# Patient Record
Sex: Female | Born: 1965 | Race: White | Hispanic: No | Smoking: Current every day smoker
Health system: Southern US, Community
[De-identification: ages and names within clinical notes are randomized; demographics above are authoritative.]

## PROBLEM LIST (undated history)

## (undated) DIAGNOSIS — I10 Essential (primary) hypertension: Secondary | ICD-10-CM

## (undated) DIAGNOSIS — O24919 Unspecified diabetes mellitus in pregnancy, unspecified trimester: Secondary | ICD-10-CM

## (undated) DIAGNOSIS — R87810 Cervical high risk human papillomavirus (HPV) DNA test positive: Secondary | ICD-10-CM

## (undated) DIAGNOSIS — K219 Gastro-esophageal reflux disease without esophagitis: Secondary | ICD-10-CM

## (undated) DIAGNOSIS — K589 Irritable bowel syndrome without diarrhea: Secondary | ICD-10-CM

## (undated) DIAGNOSIS — R197 Diarrhea, unspecified: Secondary | ICD-10-CM

## (undated) DIAGNOSIS — F419 Anxiety disorder, unspecified: Secondary | ICD-10-CM

## (undated) HISTORY — DX: Essential (primary) hypertension: I10

## (undated) HISTORY — DX: Unspecified diabetes mellitus in pregnancy, unspecified trimester: O24.919

## (undated) HISTORY — DX: Diarrhea, unspecified: R19.7

## (undated) HISTORY — DX: Gastro-esophageal reflux disease without esophagitis: K21.9

## (undated) HISTORY — DX: Anxiety disorder, unspecified: F41.9

## (undated) HISTORY — DX: Cervical high risk human papillomavirus (HPV) DNA test positive: R87.810

## (undated) HISTORY — DX: Irritable bowel syndrome, unspecified: K58.9

---
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
## 1998-02-08 ENCOUNTER — Other Ambulatory Visit: Admission: RE | Admit: 1998-02-08 | Discharge: 1998-02-08 | Payer: Self-pay | Admitting: *Deleted

## 1998-08-17 HISTORY — PX: CHOLECYSTECTOMY: SHX55

## 2000-03-10 ENCOUNTER — Other Ambulatory Visit: Admission: RE | Admit: 2000-03-10 | Discharge: 2000-03-10 | Payer: Self-pay | Admitting: Gynecology

## 2000-08-17 HISTORY — PX: TUBAL LIGATION: SHX77

## 2002-03-29 ENCOUNTER — Encounter (INDEPENDENT_AMBULATORY_CARE_PROVIDER_SITE_OTHER): Payer: Self-pay | Admitting: Internal Medicine

## 2002-03-29 ENCOUNTER — Ambulatory Visit (HOSPITAL_COMMUNITY): Admission: RE | Admit: 2002-03-29 | Discharge: 2002-03-29 | Payer: Self-pay | Admitting: Internal Medicine

## 2003-10-22 ENCOUNTER — Ambulatory Visit (HOSPITAL_COMMUNITY): Admission: RE | Admit: 2003-10-22 | Discharge: 2003-10-22 | Payer: Self-pay | Admitting: Family Medicine

## 2003-11-25 ENCOUNTER — Emergency Department (HOSPITAL_COMMUNITY): Admission: AD | Admit: 2003-11-25 | Discharge: 2003-11-25 | Payer: Self-pay | Admitting: Family Medicine

## 2004-01-10 ENCOUNTER — Ambulatory Visit (HOSPITAL_COMMUNITY): Admission: RE | Admit: 2004-01-10 | Discharge: 2004-01-10 | Payer: Self-pay | Admitting: Internal Medicine

## 2004-05-27 ENCOUNTER — Ambulatory Visit (HOSPITAL_COMMUNITY): Admission: RE | Admit: 2004-05-27 | Discharge: 2004-05-27 | Payer: Self-pay | Admitting: Obstetrics & Gynecology

## 2006-09-06 ENCOUNTER — Ambulatory Visit (HOSPITAL_COMMUNITY): Admission: RE | Admit: 2006-09-06 | Discharge: 2006-09-06 | Payer: Self-pay | Admitting: Obstetrics and Gynecology

## 2006-09-21 ENCOUNTER — Encounter: Admission: RE | Admit: 2006-09-21 | Discharge: 2006-09-21 | Payer: Self-pay | Admitting: Obstetrics and Gynecology

## 2008-10-02 ENCOUNTER — Other Ambulatory Visit: Admission: RE | Admit: 2008-10-02 | Discharge: 2008-10-02 | Payer: Self-pay | Admitting: Obstetrics & Gynecology

## 2008-10-02 ENCOUNTER — Ambulatory Visit (HOSPITAL_COMMUNITY): Admission: RE | Admit: 2008-10-02 | Discharge: 2008-10-02 | Payer: Self-pay | Admitting: Obstetrics & Gynecology

## 2009-08-17 HISTORY — PX: ENDOMETRIAL ABLATION: SHX621

## 2009-12-06 ENCOUNTER — Ambulatory Visit (HOSPITAL_COMMUNITY): Admission: RE | Admit: 2009-12-06 | Discharge: 2009-12-06 | Payer: Self-pay | Admitting: Family Medicine

## 2010-06-23 ENCOUNTER — Other Ambulatory Visit: Admission: RE | Admit: 2010-06-23 | Discharge: 2010-06-23 | Payer: Self-pay | Admitting: Obstetrics & Gynecology

## 2010-06-23 ENCOUNTER — Ambulatory Visit (HOSPITAL_COMMUNITY)
Admission: RE | Admit: 2010-06-23 | Discharge: 2010-06-23 | Payer: Self-pay | Source: Home / Self Care | Admitting: Family Medicine

## 2010-07-30 ENCOUNTER — Ambulatory Visit (HOSPITAL_COMMUNITY)
Admission: RE | Admit: 2010-07-30 | Discharge: 2010-07-30 | Payer: Self-pay | Source: Home / Self Care | Attending: Obstetrics & Gynecology | Admitting: Obstetrics & Gynecology

## 2010-09-07 ENCOUNTER — Encounter: Payer: Self-pay | Admitting: Obstetrics and Gynecology

## 2010-10-28 LAB — SURGICAL PCR SCREEN
MRSA, PCR: NEGATIVE
Staphylococcus aureus: NEGATIVE

## 2010-10-28 LAB — COMPREHENSIVE METABOLIC PANEL
ALT: 9 U/L (ref 0–35)
AST: 13 U/L (ref 0–37)
Albumin: 4.3 g/dL (ref 3.5–5.2)
Alkaline Phosphatase: 54 U/L (ref 39–117)
BUN: 9 mg/dL (ref 6–23)
CO2: 27 mEq/L (ref 19–32)
Calcium: 9.5 mg/dL (ref 8.4–10.5)
Chloride: 103 mEq/L (ref 96–112)
Creatinine, Ser: 0.6 mg/dL (ref 0.4–1.2)
GFR calc Af Amer: >60 mL/min (ref >60)
GFR calc non Af Amer: >60 mL/min (ref >60)
Glucose, Bld: 98 mg/dL (ref 70–99)
Potassium: 3.9 mEq/L (ref 3.5–5.1)
Sodium: 137 mEq/L (ref 135–145)
Total Bilirubin: 0.6 mg/dL (ref 0.3–1.2)
Total Protein: 6.9 g/dL (ref 6.0–8.3)

## 2010-10-28 LAB — CBC
HCT: 42 % (ref 36.0–46.0)
Hemoglobin: 15.2 g/dL — ABNORMAL HIGH (ref 12.0–15.0)
MCH: 33.1 pg (ref 26.0–34.0)
MCHC: 36.2 g/dL — ABNORMAL HIGH (ref 30.0–36.0)
MCV: 91.5 fL (ref 78.0–100.0)
Platelets: 189 10*3/uL (ref 150–400)
RBC: 4.59 MIL/uL (ref 3.87–5.11)
RDW: 13.7 % (ref 11.5–15.5)
WBC: 9.8 10*3/uL (ref 4.0–10.5)

## 2010-10-28 LAB — URINALYSIS, ROUTINE W REFLEX MICROSCOPIC
Bilirubin Urine: NEGATIVE
Glucose, UA: NEGATIVE mg/dL
Ketones, ur: NEGATIVE mg/dL
Leukocytes, UA: NEGATIVE
Nitrite: NEGATIVE
Protein, ur: NEGATIVE mg/dL
Specific Gravity, Urine: 1.02 (ref 1.005–1.030)
Urobilinogen, UA: 0.2 mg/dL (ref 0.0–1.0)
pH: 6.5 (ref 5.0–8.0)

## 2010-10-28 LAB — URINE MICROSCOPIC-ADD ON

## 2010-10-28 LAB — HCG, QUANTITATIVE, PREGNANCY: hCG, Beta Chain, Quant, S: <2 m[IU]/mL (ref <5)

## 2011-01-02 NOTE — Consult Note (Signed)
NAMEANNISE, BORAN                           ACCOUNT NO.:  [id number]   MEDICAL RECORD NO.:  [id number]                  PATIENT TYPE:   LOCATION:                                       FACILITY:   PHYSICIAN:  Lionel December, M.D.                 DATE OF BIRTH:  Nov 20, 1965   DATE OF CONSULTATION:  01/09/2004  DATE OF DISCHARGE:                                   CONSULTATION   REFERRING PHYSICIAN:  Scott A. Gerda Diss, M.D.   REASON FOR CONSULTATION:  Right upper quadrant abdominal pain.   HISTORY OF PRESENT ILLNESS:  Mrs. Zakayla Mcdougald is a 45 year old, Caucasian  female who presents with several month history of right upper quadrant  abdominal pain  which radiates to her back and right shoulder.  She is also  complaining of bilateral low back pain.  Chest pain feels just like her  gallbladder.  She notes the pain is intermittent, however, episodes had  been coming more frequently lately.  She is complaining of nausea, however,  denies any emesis.  She rates pain 6/10 on a pain scale with the pain  lasting several hours to days.  She notes the pain is relieved with  consumption of food.  She has been on Prevacid for awhile which was not  helping any.  She has been changed to Aciphex over the last two weeks which  has helped about 50% with her symptoms of belching, however, it has not  helped with the right upper quadrant pain.  She does have history of  intermittent GERD, however, she denies any chest pain and reports occasional  regurgitation.  She denies any dysphagia, odynophagia and has suffered from  IBS for several years now.  Her bowel movements are very irregular with two  to three per week or she can have multiple per day that are typically soft  and brown.  She denies any melena.  She does report one to two episodes of  small volume intermittent hematochezia post defecation noted on the toilet  paper over the last several months.   CURRENT MEDICATIONS:  Aciphex 20 mg  daily.   ALLERGIES:  MOTRIN.   FAMILY HISTORY:  Positive for paternal grandmother with colon cancer  diagnosed in her 49s.  Mother, age 71, has history of hypertension and  hyperlipidemia.  Father, age 17, has history of colon polyps diagnosed in  his 9s, however, she does not have details on this.  She has two brothers,  one with colon polyps of unknown etiology.   SOCIAL HISTORY:  Mrs. Shanker has been married x14 years.  She has two  children ages 32 and 45, both are healthy.  She is employed full-time as a  Diplomatic Services operational officer.  She reports 20-year history of smoking one pack per day.  She  reports an occasional social drink.  Denies any drug use.   REVIEW OF SYMPTOMS:  CONSTITUTIONAL:  Weight is up 11 pounds in the last two  years.  Appetite is okay.  Denies any fatigue, fever or chills.  Occasional  night sweats.  GASTROINTESTINAL:  See HPI.  GYN:  LMP was one week ago.  She  does have irregular cycles with typical length of menses between 1-2 weeks.  She has had tubal ligation.  GENITOURINARY:  Denies any dysuria, hematuria  or increased urinary frequency.   PAST SURGICAL HISTORY:  1. Status post cholecystectomy in March 2000, in [place], secondary to     cholelithiasis.  2. C-section x2.  3. Tubal ligation in March 2002.  4. Appendectomy in 1983.   PHYSICAL EXAMINATION:  VITAL SIGNS:  Weight 116 pounds, height 59 inches.  Blood pressure 140/80, pulse 66.  GENERAL:  Ms. Klinkner is a 45 year old, well-developed, well-nourished,  Caucasian female in no acute distress.  HEENT:  Sclerae clear.  Nonicteric.  Conjunctivae pink.  Oropharynx pink and  moist without any lesions.  NECK:  Supple without mass or thyromegaly.  HEART:  Regular rate and rhythm without murmurs, rubs or gallops.  LUNGS:  Clear to auscultation bilaterally.  ABDOMEN:  Positive bowel sounds x4, soft, nontender, nondistended, no  palpable masses or hepatosplenomegaly.  No CVA tenderness.  No rebound  tenderness or  guarding.  RECTAL:  No external hemorrhoids or fissures visualized, good sphincter  tone.  Small amount of light-brown Hemoccult stool was obtained from the  vault.  She did have palpable fullness anteriorly in the rectum about 3 cm,  however, there was no discrete mass.   ASSESSMENT:  Mrs. Glassberg is a 45 year old, Caucasian female with several  month history of intermittent right upper quadrant abdominal pain as well as  nausea.  She describes the pain similar to when she had episodes with her  gallbladder.  She denies any emesis.  She also has long-standing history of  irritable bowel syndrome and notes some recent intermittent hematochezia.  Would recommend further evaluation at this point and time to rule out  choledocholithiasis and if this is negative, would proceed with EGD and  colonoscopy given her history of intermittent hematochezia to rule out  inflammatory bowel disease.   RECOMMENDATIONS:  1. Continue Aciphex 20 mg daily.  2. Labs today to include CBC, LFTs and sedimentation rate.  3. Abdominal ultrasound.  4. Pending abdominal ultrasound, will consider EGD and colonoscopy by Dr.     Karilyn Cota at [facility] in the near future.  5. Discussed both procedures including risks and benefits including, but not     limited to infection, bleeding, perforation, drug reaction and consent     will be obtained.  We will be contacting Ms. Rathmann with results as soon     as they are available.   We would like to thank Dr. [name] to participate in the care of Mrs.  Ivens.     ________________________________________  ___________________________________________  Nicholas Lose, N.P.                  Lionel December, M.D.   KC/MEDQ  D:  01/09/2004  T:  01/10/2004  Job:  [id number]   cc:   Lorin Picket A. Gerda Diss, M.D.  [address], [address]  [place]  [place] [address]  Fax: [phone]

## 2011-01-02 NOTE — Consult Note (Signed)
NAME:  Melinda Chapman, BURKMAN                         ACCOUNT NO.:   MEDICAL RECORD NO.:  [id number]                   PATIENT TYPE:  OUT   LOCATION:  RAD                                  FACILITY:  [facility]   PHYSICIAN:  Lionel December, M.D.                 DATE OF BIRTH:  [birth year]   DATE OF CONSULTATION:  03/27/2002  DATE OF DISCHARGE:  03/29/2002                           GASTROENTEROLOGY CONSULTATION   PRESENTING COMPLAINT:  Right-sided abdominal pain, irregular bowel  movements.   HISTORY OF PRESENT ILLNESS:  The patient is a 45 year old Caucasian female  who was referred by Dr. [name] for evaluation and treatment of  abdominal pain and irregular bowel movements.  The patient states that she  has had problems with constipation most of her life.  She was given  suppositories to make her bowels move by her mother when she was a child.  She began to have less difficulty with her bowel movements after she had her  gallbladder surgery in May 2000.  However, since then she has also had  diarrhea alternating with constipation.  When she has diarrhea she has  extreme urgency.  She knows that she better be in the bathroom, otherwise  she will be in trouble.  She recalled having had five accidents since her  gallbladder surgery.  On days she has diarrhea she does not have more than  three stools per day.  This generally is followed by a period of  constipation and she may not have a BM for four days at a time.  In the past  she has taken Correctol and/or Ex-Lax, but not recently.  For the last year  or so she has been experiencing pain in her right mid lower and upper  abdomen radiating posteriorly.  Pain is described as dull, nagging pain and  at other times she feels she has swelling in her abdomen and she cannot  breathe.  She tells me that these symptoms remind her of her gallbladder  pain.  It may last anywhere from three hours to a week.  Tylenol has helped  because it takes the edge  off.  There is no history of melena or rectal  bleeding.  She also denies dysuria or hematuria.  Her urinalysis by Dr.  [name] was normal.  She states she tends to have more pain with her  periods.   REVIEW OF SYMPTOMS:  Negative for melena, rectal bleeding, vomiting,  heartburn, or dysphagia but she has had intermittent vomiting.  Her baseline  weight is 115 pounds but for the last few months she has stayed between 105-  106.  She saw Dr. [name] about two weeks ago and she was begun on  Prilosec.  It helped her with the fullness.  However, it made her very  nervous and she therefore stopped taking the medication.  She is presently  taking no prescription medications.  PAST MEDICAL HISTORY:  She had appendectomy 1983.  She has had cesarean  section in 1994 and in 2002.  She had laparoscopic cholecystectomy May 2000.   ALLERGIES:  MOTRIN which makes her nervous.   FAMILY HISTORY:  Both parents and two sisters are in good health.  Paternal  grandmother was diagnosed with colon carcinoma around age 39 and died of it  at age 34.  Paternal aunt has had colonic polyps removed.   SOCIAL HISTORY:  She is married and has two healthy children.  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  She smokes about a  pack a day which she has done for 17 years.   PHYSICAL EXAMINATION:  GENERAL:  Pleasant, well-developed Caucasian female  who is in no acute distress.  VITAL SIGNS:  She weight 105 pounds.  She is 4 feet 11 inches tall.  Pulse  70 per minute, blood pressure 120/70, temperature 97.8.  HEENT:  Conjunctivae are pink.  Sclerae are nonicteric.  Oropharyngeal  mucosa is normal.  NECK:  Without masses or thyromegaly.  CARDIAC:  Regular rate and rhythm.  Normal S1.  No murmur or gallop noted.  LUNGS:  Clear to auscultation.  ABDOMEN:  Symmetrical.  Bowel sounds are hyperactive.  Palpation reveals  soft abdomen without tenderness, organomegaly, or masses.   RECTAL:  Guaiac negative stools.  No clubbing or edema noted.   LABORATORIES:  From March 10, 2002:  WBC 7.1, H&H 15.3 and 44.2, MCV 88.9,  platelet count 179,000.  Bilirubin 0.9, AT 71, AST 12, ALT 10, total protein  7.8, albumin 5.3, serum amylase 35.  H. pylori serology is negative.   ASSESSMENT:  The patient is a 45 year old Caucasian female with irregular  bowel movements consisting of diarrhea alternating with constipation and  extreme urgency who also has been experiencing right-sided abdominal pain  for about a year. The pain is mainly in her right mid abdomen, but also  experienced above and below that.  There is no history of melena or rectal  bleeding.  Her CBC and LFTs are normal and Helicobacter pylori serology is  negative.  Family history is significant for colon carcinoma in second  degree relatives but no immediate family member or a first degree relative  has had gastrointestinal malignancy.   I suspect the patient's symptoms are secondary to irritable bowel syndrome.  However, given the chronicity, we need to make sure she does not have  inflammatory bowel disease or Crohn's disease.   RECOMMENDATIONS:  1. Will start her on Bentyl 10 mg t.i.d.  2. Colace one tablet b.i.d.  3. Will proceed with small bowel follow through.  If small bowel study is     normal, will consider colonoscopy if she does not respond to therapy.   I would like to thank Dr. [name] for sharing care of this nice lady  with [place].                                               Lionel December, M.D.    NR/MEDQ  D:  03/27/2002  T:  03/31/2002  Job:  [id number]   cc:   Lorin Picket A. Gerda Diss, M.D.

## 2011-03-10 ENCOUNTER — Ambulatory Visit (HOSPITAL_COMMUNITY)
Admission: RE | Admit: 2011-03-10 | Discharge: 2011-03-10 | Disposition: A | Discharge status: Home / Self Care | Payer: BC Managed Care – PPO | Source: Ambulatory Visit | Attending: Family Medicine | Admitting: Family Medicine

## 2011-03-10 ENCOUNTER — Other Ambulatory Visit: Payer: Self-pay | Admitting: Family Medicine

## 2011-03-10 DIAGNOSIS — M25551 Pain in right hip: Secondary | ICD-10-CM

## 2011-03-10 DIAGNOSIS — M25559 Pain in unspecified hip: Secondary | ICD-10-CM | POA: Insufficient documentation

## 2011-03-12 ENCOUNTER — Encounter: Payer: Self-pay | Admitting: Internal Medicine

## 2011-05-11 ENCOUNTER — Ambulatory Visit (INDEPENDENT_AMBULATORY_CARE_PROVIDER_SITE_OTHER): Payer: BC Managed Care – PPO | Admitting: Internal Medicine

## 2011-05-11 ENCOUNTER — Encounter: Payer: Self-pay | Admitting: Internal Medicine

## 2011-05-11 DIAGNOSIS — R197 Diarrhea, unspecified: Secondary | ICD-10-CM

## 2011-05-11 DIAGNOSIS — R1319 Other dysphagia: Secondary | ICD-10-CM

## 2011-05-11 DIAGNOSIS — K219 Gastro-esophageal reflux disease without esophagitis: Secondary | ICD-10-CM

## 2011-05-11 MED ORDER — PEG-KCL-NACL-NASULF-NA ASC-C 100 G PO SOLR: 1.0000 | Freq: Once | ORAL | Status: DC

## 2011-05-11 NOTE — Progress Notes (Signed)
Melinda Chapman 1965/09/16 MRN [id number]   History of Present Illness:  This is a 45 year old white female with a history of irritable bowel syndrome with alternating diarrhea and constipation. She had a recent onset of solid food dysphagia. She feels food has been hesitating and there is some pain associated with it. She has never had a food impaction. She denies hoarseness or nocturnal cough. She is a smoker. She has a history of a laparoscopic cholecystectomy in March 2000 for cholelithiasis. There is a family history of colon cancer in her paternal grandmother and a history of colon polyps in her father, brother and aunt. Her father also had an esophageal stricture.  She saw Dr Karilyn Cota in 2005 for dyspepsia and alternating diarrhea and constipation. Her upper abdominal ultrasound was apparently normal and her liver function tests were normal as well. Her weight has recently decreased by 5 pounds. She is on Nexium 40 mg a day and TUMS when necessary. She was asked her to increase her Nexium to twice a day but has not done it.  Past Medical History  Diagnosis Date  . IBS (irritable bowel syndrome)   . GERD (gastroesophageal reflux disease)   . Constipation   . Diarrhea   . Hypertension   . Diabetes in pregnancy   . Anxiety    Past Surgical History  Procedure Date  . Cholecystectomy 2000    in [place]  . Cesarean section 1994, 2002    x 2  . Tubal ligation 2002  . Appendectomy 1983  . Endometrial ablation 2011    reports that she has been smoking Cigarettes.  She has never used smokeless tobacco. She reports that she drinks alcohol. She reports that she does not use illicit drugs. family history includes Colon cancer in her paternal grandmother; Colon polyps in her brother, father, and paternal aunt; Hyperlipidemia in her mother; and Hypertension in her mother. No Known Allergies      Review of Systems:Positive for dysphagia and odynophagia. Denies shortness of breath positive for  chest pain. Diarrhea and constipation. Denies rectal bleeding  The remainder of the 10  point ROS is negative except as outlined in H&P   Physical Exam: General appearance  Well developed, in no distress. Eyes- non icteric. HEENT nontraumatic, normocephalic. Mouth no lesions, tongue papillated, no cheilosis, no thrush. Neck supple without adenopathy, thyroid not enlarged, no carotid bruits, no JVD. Lungs Clear to auscultation bilaterally. Cor normal S1 normal S2, regular rhythm , no murmur,  quiet precordium. Abdomen: soft with mild tenderness in right middle quadrant. No palpable mass or stool. Liver edge at costal margin. Lower abdomen unremarkable. Rectal:Soft Hemoccult negative stool.  Extremities no pedal edema. Skin no lesions, slight palmar erythema. Neurological alert and oriented x 3. Psychological normal mood and affect.  Assessment and Plan:  Problem #1 solid food dysphagia suggestive of an esophageal stricture, ring or web. She has a history of gastroesophageal reflux; possibly Barrett's esophagus or a hiatal hernia. She is on medical therapy which is only partially effective. We will proceed with an upper endoscopy and dilatation if necessary. Advised  smoking cessation, antireflux measures and additional antacids when necessary.  Problem #2 irritable bowel syndrome. She has a family history of colon polyps and colon cancer. She will be 45 in December. I suggest a colonoscopy at the time of upper endoscopy.    05/11/2011 Lina Sar

## 2011-05-11 NOTE — Patient Instructions (Addendum)
One of your biggest health concerns is your smoking.  This increases your risk for most cancers and serious cardiovascular diseases such as strokes, heart attacks.  You should try your best to stop.  If you need assistance, please contact your PCP or Smoking Cessation Class at [facility] [phone]) or [facility] Quit-Line (1-800-QUIT-NOW). You have been given a separate informational sheet regarding your tobacco use, the importance of quitting and local resources to help you quit. You have been scheduled for an endoscopy and colonoscopy. Please follow the written instructions given to you at your visit today. Please pick up your Moviprep at the pharmacy within the next 2-3 days. CC: Dr [name]

## 2011-06-11 ENCOUNTER — Encounter: Payer: BC Managed Care – PPO | Admitting: Internal Medicine

## 2011-06-16 ENCOUNTER — Ambulatory Visit (AMBULATORY_SURGERY_CENTER): Payer: BC Managed Care – PPO | Admitting: Internal Medicine

## 2011-06-16 ENCOUNTER — Encounter: Payer: Self-pay | Admitting: Internal Medicine

## 2011-06-16 DIAGNOSIS — Z1211 Encounter for screening for malignant neoplasm of colon: Secondary | ICD-10-CM

## 2011-06-16 DIAGNOSIS — R197 Diarrhea, unspecified: Secondary | ICD-10-CM

## 2011-06-16 DIAGNOSIS — Z83719 Family history of colon polyps, unspecified: Secondary | ICD-10-CM

## 2011-06-16 DIAGNOSIS — D131 Benign neoplasm of stomach: Secondary | ICD-10-CM

## 2011-06-16 DIAGNOSIS — K219 Gastro-esophageal reflux disease without esophagitis: Secondary | ICD-10-CM

## 2011-06-16 DIAGNOSIS — Z8371 Family history of colonic polyps: Secondary | ICD-10-CM

## 2011-06-16 DIAGNOSIS — R1319 Other dysphagia: Secondary | ICD-10-CM

## 2011-06-16 DIAGNOSIS — Z800 Family history of malignant neoplasm of digestive organs: Secondary | ICD-10-CM

## 2011-06-16 MED ORDER — SODIUM CHLORIDE 0.9 % IV SOLN: 500.0000 mL | INTRAVENOUS | Status: DC

## 2011-06-16 MED ORDER — DICYCLOMINE HCL 20 MG PO TABS: 20.0000 mg | ORAL_TABLET | Freq: Three times a day (TID) | ORAL | Status: DC | PRN

## 2011-06-16 NOTE — Patient Instructions (Signed)
FOLLOW THE DISCHARGE INSTRUCTIONS ON THE GREEN AND BLUE INSTRUCTION SHEETS.  FOLLOW THE DILATATION DIET:     NOTHING TO EAT OR DRINK UNTIL 11:00 TODAY.    11:00 AM UNTIL 12:00 PM CLEAR LIQUIDS ONLY.     12:00 THROUGH THE REMAINDER OF THE DAY SOFT DIET. RESUME YOUR REGULAR DIET TOMORROW, 06/17/11.  RESUME YOUR MEDICATIONS INCLUDING YOUR NEXIUM. NEW PRESCRIPTION FOR BENTYL 20 MG BY MOUTH TWICE DAILY.  AWAIT BIOPSY RESULTS.

## 2011-06-17 ENCOUNTER — Telehealth: Payer: Self-pay | Admitting: *Deleted

## 2011-06-17 NOTE — Telephone Encounter (Signed)
No answer, message left for the patient. 

## 2011-06-22 ENCOUNTER — Encounter: Payer: Self-pay | Admitting: Internal Medicine

## 2011-08-21 ENCOUNTER — Other Ambulatory Visit: Payer: Self-pay | Admitting: Obstetrics & Gynecology

## 2011-08-21 DIAGNOSIS — Z139 Encounter for screening, unspecified: Secondary | ICD-10-CM

## 2011-08-31 ENCOUNTER — Other Ambulatory Visit (HOSPITAL_COMMUNITY)
Admission: RE | Admit: 2011-08-31 | Discharge: 2011-08-31 | Disposition: A | Discharge status: Home / Self Care | Payer: BC Managed Care – PPO | Source: Ambulatory Visit | Attending: Obstetrics and Gynecology | Admitting: Obstetrics and Gynecology

## 2011-08-31 ENCOUNTER — Other Ambulatory Visit: Payer: Self-pay | Admitting: Adult Health

## 2011-08-31 ENCOUNTER — Ambulatory Visit (HOSPITAL_COMMUNITY)
Admission: RE | Admit: 2011-08-31 | Discharge: 2011-08-31 | Disposition: A | Discharge status: Home / Self Care | Payer: BC Managed Care – PPO | Source: Ambulatory Visit | Attending: Obstetrics & Gynecology | Admitting: Obstetrics & Gynecology

## 2011-08-31 DIAGNOSIS — Z139 Encounter for screening, unspecified: Secondary | ICD-10-CM

## 2011-08-31 DIAGNOSIS — Z1231 Encounter for screening mammogram for malignant neoplasm of breast: Secondary | ICD-10-CM | POA: Insufficient documentation

## 2011-08-31 DIAGNOSIS — Z01419 Encounter for gynecological examination (general) (routine) without abnormal findings: Secondary | ICD-10-CM | POA: Insufficient documentation

## 2013-02-24 ENCOUNTER — Emergency Department (HOSPITAL_COMMUNITY)
Admission: EM | Admit: 2013-02-24 | Discharge: 2013-02-24 | Disposition: A | Discharge status: Home / Self Care | Payer: BC Managed Care – PPO | Attending: Emergency Medicine | Admitting: Emergency Medicine

## 2013-02-24 ENCOUNTER — Encounter (HOSPITAL_COMMUNITY): Payer: Self-pay

## 2013-02-24 ENCOUNTER — Emergency Department (HOSPITAL_COMMUNITY): Payer: BC Managed Care – PPO

## 2013-02-24 DIAGNOSIS — R0602 Shortness of breath: Secondary | ICD-10-CM | POA: Insufficient documentation

## 2013-02-24 DIAGNOSIS — K219 Gastro-esophageal reflux disease without esophagitis: Secondary | ICD-10-CM | POA: Insufficient documentation

## 2013-02-24 DIAGNOSIS — R42 Dizziness and giddiness: Secondary | ICD-10-CM | POA: Insufficient documentation

## 2013-02-24 DIAGNOSIS — F419 Anxiety disorder, unspecified: Secondary | ICD-10-CM

## 2013-02-24 DIAGNOSIS — Z8719 Personal history of other diseases of the digestive system: Secondary | ICD-10-CM | POA: Insufficient documentation

## 2013-02-24 DIAGNOSIS — F411 Generalized anxiety disorder: Secondary | ICD-10-CM | POA: Insufficient documentation

## 2013-02-24 DIAGNOSIS — I10 Essential (primary) hypertension: Secondary | ICD-10-CM | POA: Insufficient documentation

## 2013-02-24 DIAGNOSIS — R209 Unspecified disturbances of skin sensation: Secondary | ICD-10-CM | POA: Insufficient documentation

## 2013-02-24 DIAGNOSIS — Z79899 Other long term (current) drug therapy: Secondary | ICD-10-CM | POA: Insufficient documentation

## 2013-02-24 DIAGNOSIS — K589 Irritable bowel syndrome without diarrhea: Secondary | ICD-10-CM | POA: Insufficient documentation

## 2013-02-24 DIAGNOSIS — F17200 Nicotine dependence, unspecified, uncomplicated: Secondary | ICD-10-CM | POA: Insufficient documentation

## 2013-02-24 LAB — CBC WITH DIFFERENTIAL/PLATELET
Basophils Absolute: 0 10*3/uL (ref 0.0–0.1)
HCT: 48.9 % — ABNORMAL HIGH (ref 36.0–46.0)
Hemoglobin: 17.7 g/dL — ABNORMAL HIGH (ref 12.0–15.0)
Lymphocytes Relative: 16 % (ref 12–46)
Monocytes Absolute: 0.9 10*3/uL (ref 0.1–1.0)
Monocytes Relative: 9 % (ref 3–12)
Neutro Abs: 8.2 10*3/uL — ABNORMAL HIGH (ref 1.7–7.7)
RBC: 5.19 MIL/uL — ABNORMAL HIGH (ref 3.87–5.11)
WBC: 11 10*3/uL — ABNORMAL HIGH (ref 4.0–10.5)

## 2013-02-24 LAB — BASIC METABOLIC PANEL
Chloride: 97 mEq/L (ref 96–112)
GFR calc Af Amer: >90 mL/min (ref >90)
Potassium: 3.6 mEq/L (ref 3.5–5.1)

## 2013-02-24 MED ORDER — LORAZEPAM 1 MG PO TABS
1.0000 mg | ORAL_TABLET | Freq: Once | ORAL | Status: AC
  Administered 2013-02-24: 0.5 mg via ORAL
  Filled 2013-02-24: qty 1

## 2013-02-24 MED ORDER — LORAZEPAM 1 MG PO TABS: 1.0000 mg | ORAL_TABLET | Freq: Two times a day (BID) | ORAL | Status: DC | PRN

## 2013-02-24 NOTE — ED Notes (Signed)
Pt reports was at work today around 1430 and became SOB.  Reports has history of similar episodes but usually can calm herself down and breathe through it.  Reports episode usually only lasts 5 min or so.  Today episode has been since 2:30 pm.  Pt denies feeling especially anxious about something.  Says has a lot of stress in her life but states nothing in particular happened today to trigger this reaction.  Pt denies any pain.  Reports was hyperventilaing with EMS and hands felt tingly.  Denies any chest pain.  Pt says feels like has to take in a deep breath to catch her breath.

## 2013-02-24 NOTE — ED Provider Notes (Signed)
History    This chart was scribed for Donnetta Hutching, MD, by Frederik Pear, ED scribe. The patient was seen in room APA15/APA15 and the patient's care was started at 1621.   CSN: [id number] Arrival date & time 02/24/13  1605  First MD Initiated Contact with Patient 02/24/13 1621     No chief complaint on file.  (Consider location/radiation/quality/duration/timing/severity/associated sxs/prior Treatment) The history is provided by the patient and medical records. No language interpreter was used.   HPI Comments: Melinda Chapman is a 47 y.o. female with a h/o of hypertension brought in by EMS who presents to the Emergency Department complaining of constant, moderate SOB with associated lightheaded and dizziness that began at 1430 without exertion while she was at work. She states that her chest feels "cold" and has a "light and airy" sensation.  She states that she feels as if she must take deep breaths in order to catch her breath. Denies CP.  She reports a h/o of similar episodes that were previously relieved by calming herself down and focusing on her breathing. She reports she left her desk and attempted to walk around to calm herself down without relief. She walked to the drug store and took her BP, which was 190/99. EMS reports she was hyperventilating, and she complained of tingling in her hands upon arrival. In ED, her BP is 105/86. Denies cold-like symptoms in the past week. Denies BC use. She reports she was given a prescription of 0.25 mg Xanax approximately 3 years ago and occasionally will take 0.5 of a tablet. She expresses major concerns with taking antipsychotic medications due to her husband's severe addiction problems.    She has a h/o of GERD. She is a current, 1 pack a day, every day smoker. She occasionally consumes a light amount of ETOH. She is employed as a [employer] operational officer.    Past Medical History  Diagnosis Date  . IBS (irritable bowel syndrome)   . GERD (gastroesophageal reflux  disease)   . Constipation   . Diarrhea   . Hypertension   . Diabetes in pregnancy   . Anxiety    Past Surgical History  Procedure Laterality Date  . Cholecystectomy  2000    in [place]  . Cesarean section  1994, 2002    x 2  . Tubal ligation  2002  . Appendectomy  1983  . Endometrial ablation  2011   Family History  Problem Relation Age of Onset  . Colon cancer Paternal Grandmother   . Hypertension Mother   . Hyperlipidemia Mother   . Colon polyps Father   . Colon polyps Brother   . Colon polyps Paternal Aunt   . Esophageal cancer Neg Hx   . Stomach cancer Neg Hx    History  Substance Use Topics  . Smoking status: Current Every Day Smoker -- 2.00 packs/day    Types: Cigarettes  . Smokeless tobacco: Never Used     Comment: Counseling paper given to patient in room   . Alcohol Use: 3.0 oz/week    5 Cans of beer per week     Comment: occasional   OB History   Grav Para Term Preterm Abortions TAB SAB Ect Mult Living                 Review of Systems A complete 10 system review of systems was obtained and all systems are negative except as noted in the HPI and PMH.  Allergies  Review of patient's allergies  indicates no known allergies.  Home Medications   Current Outpatient Rx  Name  Route  Sig  Dispense  Refill  . esomeprazole (NEXIUM) 40 MG capsule   Oral   Take 40 mg by mouth daily before breakfast.           . lisinopril (PRINIVIL,ZESTRIL) 10 MG tablet   Oral   Take 10 mg by mouth daily.           Marland Kitchen EXPIRED: dicyclomine (BENTYL) 20 MG tablet   Oral   Take 1 tablet (20 mg total) by mouth 3 (three) times daily as needed.   60 tablet   1    BP 105/86  Pulse 77  Temp(Src) 98.8 F (37.1 C) (Oral)  Resp 20  Ht 5' (1.524 m)  Wt 110 lb (49.896 kg)  BMI 21.48 kg/m2  SpO2 100% Physical Exam  Nursing note and vitals reviewed. Constitutional: She is oriented to person, place, and time. She appears well-developed and well-nourished.  Tearful on  exam.   HENT:  Head: Normocephalic and atraumatic.  Eyes: Conjunctivae and EOM are normal. Pupils are equal, round, and reactive to light.  Neck: Normal range of motion. Neck supple.  Cardiovascular: Normal rate, regular rhythm and normal heart sounds.   Pulmonary/Chest: Effort normal and breath sounds normal.  Slight tachypnea.   Abdominal: Soft. Bowel sounds are normal.  Musculoskeletal: Normal range of motion.  Neurological: She is alert and oriented to person, place, and time.  Skin: Skin is warm and dry.  Psychiatric: Her mood appears anxious.  Anxious.     ED Course  Procedures (including critical care time)  DIAGNOSTIC STUDIES: Oxygen Saturation is 100% on room air, normal by my interpretation.    COORDINATION OF CARE:  16:47- Discussed planned course of treatment with the patient, including an EKG, chest X-ray, Ativan, and blood work, who is agreeable at this time.  Results for orders placed during the hospital encounter of 02/24/13  BASIC METABOLIC PANEL      Result Value Range   Sodium 135  135 - 145 mEq/L   Potassium 3.6  3.5 - 5.1 mEq/L   Chloride 97  96 - 112 mEq/L   CO2 24  19 - 32 mEq/L   Glucose, Bld 107 (*) 70 - 99 mg/dL   BUN 8  6 - 23 mg/dL   Creatinine, Ser 4.09  0.50 - 1.10 mg/dL   Calcium 9.9  8.4 - 81.1 mg/dL   GFR calc non Af Amer >90  >90 mL/min   GFR calc Af Amer >90  >90 mL/min  CBC WITH DIFFERENTIAL      Result Value Range   WBC 11.0 (*) 4.0 - 10.5 K/uL   RBC 5.19 (*) 3.87 - 5.11 MIL/uL   Hemoglobin 17.7 (*) 12.0 - 15.0 g/dL   HCT 91.4 (*) 78.2 - 95.6 %   MCV 94.2  78.0 - 100.0 fL   MCH 34.1 (*) 26.0 - 34.0 pg   MCHC 36.2 (*) 30.0 - 36.0 g/dL   RDW 21.3  08.6 - 57.8 %   Platelets 164  150 - 400 K/uL   Neutrophils Relative % 74  43 - 77 %   Neutro Abs 8.2 (*) 1.7 - 7.7 K/uL   Lymphocytes Relative 16  12 - 46 %   Lymphs Abs 1.8  0.7 - 4.0 K/uL   Monocytes Relative 9  3 - 12 %   Monocytes Absolute 0.9  0.1 - 1.0 K/uL  Eosinophils  Relative 1  0 - 5 %   Eosinophils Absolute 0.1  0.0 - 0.7 K/uL   Basophils Relative 0  0 - 1 %   Basophils Absolute 0.0  0.0 - 0.1 K/uL  TROPONIN I      Result Value Range   Troponin I <0.30  <0.30 ng/mL  D-DIMER, QUANTITATIVE      Result Value Range   D-Dimer, Quant <0.27  0.00 - 0.48 ug/mL-FEU   Labs Reviewed  CBC WITH DIFFERENTIAL - Abnormal; Notable for the following:    WBC 11.0 (*)    RBC 5.19 (*)    Hemoglobin 17.7 (*)    HCT 48.9 (*)    MCH 34.1 (*)    MCHC 36.2 (*)    Neutro Abs 8.2 (*)    All other components within normal limits  D-DIMER, QUANTITATIVE  BASIC METABOLIC PANEL  TROPONIN I   Dg Chest 2 View  02/24/2013   *RADIOLOGY REPORT*  Clinical Data: Shortness of breath  CHEST - 2 VIEW  Comparison: 12/06/2009  Findings: Cardiomediastinal silhouette is stable.  No acute infiltrate or pleural effusion.  No pulmonary edema.  Bony thorax is unremarkable.  IMPRESSION: No active disease.   Original Report Authenticated By: Natasha Mead, M.D.   No diagnosis found.  Date: 02/24/2013  Rate: 75  Rhythm: normal sinus rhythm  QRS Axis: normal  Intervals: normal  ST/T Wave abnormalities: normal  Conduction Disutrbances: incomplete RBBB  Narrative Interpretation: unremarkable    MDM  History and physical more consistent with anxiety attack. Patient is low risk for acute coronary syndrome or pulmonary embolus.   Screening test negative including EKG, troponin, d-dimer.  Ativan 1 mg seemed to help.    Discharge meds Ativan 1 mg #15.   Patient has primary care follow up  I personally performed the services described in this documentation, which was scribed in my presence. The recorded information has been reviewed and is accurate.    Donnetta Hutching, MD 02/24/13 [phone]

## 2013-02-24 NOTE — ED Notes (Addendum)
Pt states her life is full of stress. Crying and hyperventilating on arrival to ED. States nothing unusual has happened today  Note was typed by J. Young Charity fundraiser

## 2013-03-14 ENCOUNTER — Encounter: Payer: Self-pay | Admitting: Family Medicine

## 2013-03-14 ENCOUNTER — Ambulatory Visit (INDEPENDENT_AMBULATORY_CARE_PROVIDER_SITE_OTHER): Payer: BC Managed Care – PPO | Admitting: Family Medicine

## 2013-03-14 VITALS — BP 142/86 | HR 90 | Wt 109.2 lb

## 2013-03-14 DIAGNOSIS — F430 Acute stress reaction: Secondary | ICD-10-CM

## 2013-03-14 DIAGNOSIS — F410 Panic disorder [episodic paroxysmal anxiety] without agoraphobia: Secondary | ICD-10-CM | POA: Insufficient documentation

## 2013-03-14 DIAGNOSIS — I10 Essential (primary) hypertension: Secondary | ICD-10-CM | POA: Insufficient documentation

## 2013-03-14 DIAGNOSIS — R4589 Other symptoms and signs involving emotional state: Secondary | ICD-10-CM

## 2013-03-14 MED ORDER — LISINOPRIL 20 MG PO TABS: 20.0000 mg | ORAL_TABLET | Freq: Every day | ORAL | Status: DC

## 2013-03-14 MED ORDER — CITALOPRAM HYDROBROMIDE 20 MG PO TABS: 20.0000 mg | ORAL_TABLET | Freq: Every day | ORAL | Status: DC

## 2013-03-14 MED ORDER — LORAZEPAM 0.5 MG PO TABS: 0.5000 mg | ORAL_TABLET | Freq: Two times a day (BID) | ORAL | Status: DC

## 2013-03-14 NOTE — Progress Notes (Signed)
  Subjective:    Patient ID: Melinda Chapman, female    DOB: 1966-04-05, 47 y.o.   MRN: [id number]  Hypertension The current episode started more than 1 year ago. Associated symptoms include anxiety.   This patient comes in today very stressed. Her husband has been having a prescription drug problem for years it is resulted in numerous problems at home. Unfortunately she has had to separate from him. She is having a lot of anxiety nervousness she even had a panic attack went to the ER thought she was having a heart attack that had thorough testing on her. She now relates she is here because of blood pressure and anxiety she denies being depressed not suicidal. Denies headaches does have some palpitations. PMH hypertension family history reviewed social history she smokes she knows she needs to quit  Review of Systems Denies chest pain severe headaches nausea vomiting diarrhea    Objective:   Physical Exam Lungs are clear no crackles heart is regular pulses normal blood pressure is elevated abdomen soft extremities no edema       Assessment & Plan:  #1 hypertension-increase lisinopril 20 mg daily. Patient to followup in approximately 2 months to see a blood pressure don't call us if any concerns #2 tobacco abuse patient was encouraged to quit smoking but it'll be difficult for her to do so currently with the stressors going on #3 anxiety issues with panic attacks-it does not look like this will get any better over the next few months I would recommend Celexa 20 mg one half daily I also recommend Ativan 0.5 mg use sparingly as necessary for anxiety issues. Warning signs were discussed if she starts having severe depression or other issues she needs to followup otherwise we'll see her back in 2 months

## 2013-03-15 ENCOUNTER — Telehealth: Payer: Self-pay | Admitting: Family Medicine

## 2013-03-15 NOTE — Telephone Encounter (Signed)
I do rec 1/2 daily ongoing. When she follows up if med not doing enough then we will increase to one daily. Stick with 1/2 per day for now.

## 2013-03-15 NOTE — Telephone Encounter (Signed)
Discussed with patient. Patient verbalized understanding. 

## 2013-03-15 NOTE — Telephone Encounter (Signed)
Patient was seen on Tuesday March 14, 2013 by Dr. Lorin Picket.  Patient states she was under the impression to take 1/2 tablet daily of citalopram (CELEXA) 20 MG tablet.  However the directions on the bottle states to take tablet 1 tablet daily.  Please call patient  Thanks

## 2013-05-16 ENCOUNTER — Ambulatory Visit: Payer: BC Managed Care – PPO | Admitting: Family Medicine

## 2013-06-22 ENCOUNTER — Other Ambulatory Visit: Payer: Self-pay

## 2013-09-19 ENCOUNTER — Other Ambulatory Visit: Payer: Self-pay

## 2013-09-19 MED ORDER — LORAZEPAM 0.5 MG PO TABS: 0.5000 mg | ORAL_TABLET | Freq: Two times a day (BID) | ORAL | Status: DC | PRN

## 2013-10-19 ENCOUNTER — Other Ambulatory Visit: Payer: Self-pay | Admitting: Family Medicine

## 2014-04-17 ENCOUNTER — Other Ambulatory Visit: Payer: Self-pay | Admitting: Family Medicine

## 2014-05-08 ENCOUNTER — Encounter: Payer: Self-pay | Admitting: Family Medicine

## 2014-05-08 ENCOUNTER — Ambulatory Visit (INDEPENDENT_AMBULATORY_CARE_PROVIDER_SITE_OTHER): Payer: BC Managed Care – PPO | Admitting: Family Medicine

## 2014-05-08 VITALS — BP 130/80 | Ht 60.0 in | Wt 111.0 lb

## 2014-05-08 DIAGNOSIS — Z569 Unspecified problems related to employment: Secondary | ICD-10-CM

## 2014-05-08 DIAGNOSIS — Z79899 Other long term (current) drug therapy: Secondary | ICD-10-CM

## 2014-05-08 DIAGNOSIS — Z566 Other physical and mental strain related to work: Secondary | ICD-10-CM

## 2014-05-08 DIAGNOSIS — I10 Essential (primary) hypertension: Secondary | ICD-10-CM

## 2014-05-08 MED ORDER — CITALOPRAM HYDROBROMIDE 10 MG PO TABS: 10.0000 mg | ORAL_TABLET | Freq: Every day | ORAL | Status: DC

## 2014-05-08 MED ORDER — LORAZEPAM 0.5 MG PO TABS: 0.5000 mg | ORAL_TABLET | Freq: Two times a day (BID) | ORAL | Status: DC | PRN

## 2014-05-08 MED ORDER — LISINOPRIL 20 MG PO TABS: 20.0000 mg | ORAL_TABLET | Freq: Every day | ORAL | Status: DC

## 2014-05-08 NOTE — Progress Notes (Signed)
   Subjective:    Patient ID: Melinda Chapman, female    DOB: Dec 25, 1965, 48 y.o.   MRN: [id number]  Hypertension This is a chronic problem. The current episode started more than 1 year ago. The problem has been gradually improving since onset. The problem is controlled. Pertinent negatives include no chest pain or shortness of breath. There are no associated agents to hypertension. There are no known risk factors for coronary artery disease. Treatments tried: lisinopril. The current treatment provides significant improvement. There are no compliance problems.    Patient has no concerns at this time.  Patient is under a lot of stress at home and at work finds himself feeling stressed out a lot denies being depressed not suicidal  Review of Systems  Constitutional: Negative for activity change, appetite change and fatigue.  HENT: Negative for congestion.   Respiratory: Negative for cough, choking and shortness of breath.   Cardiovascular: Negative for chest pain.  Endocrine: Negative for polydipsia and polyphagia.  Genitourinary: Negative for frequency.  Neurological: Negative for weakness.  Psychiatric/Behavioral: Negative for confusion.       Objective:   Physical Exam  Vitals reviewed. Constitutional: She appears well-nourished. No distress.  Cardiovascular: Normal rate, regular rhythm and normal heart sounds.   No murmur heard. Pulmonary/Chest: Effort normal and breath sounds normal. No respiratory distress.  Musculoskeletal: She exhibits no edema.  Lymphadenopathy:    She has no cervical adenopathy.  Neurological: She is alert. She exhibits normal muscle tone.  Psychiatric: Her behavior is normal.          Assessment & Plan:  Hypertension overall doing well patient really needs to quit smoking I encouraged her strongly to do so continue blood pressure medication  She does have some stress-related issues she states that she will try the Celexa she will start off with 20  mg tablet half tablet daily I believe this will help her she ought to followup in 3 months time  She does need to check kidney function and cholesterol profile.

## 2014-06-01 ENCOUNTER — Other Ambulatory Visit: Payer: Self-pay | Admitting: *Deleted

## 2014-06-01 ENCOUNTER — Telehealth: Payer: Self-pay | Admitting: Family Medicine

## 2014-06-01 DIAGNOSIS — I10 Essential (primary) hypertension: Secondary | ICD-10-CM

## 2014-06-01 MED ORDER — LORAZEPAM 0.5 MG PO TABS: 0.5000 mg | ORAL_TABLET | Freq: Two times a day (BID) | ORAL | Status: AC | PRN

## 2014-06-01 MED ORDER — LISINOPRIL 20 MG PO TABS: 20.0000 mg | ORAL_TABLET | Freq: Every day | ORAL | Status: DC

## 2014-06-01 MED ORDER — ESOMEPRAZOLE MAGNESIUM 40 MG PO CPDR: DELAYED_RELEASE_CAPSULE | ORAL | Status: DC

## 2014-06-01 MED ORDER — CITALOPRAM HYDROBROMIDE 10 MG PO TABS: 10.0000 mg | ORAL_TABLET | Freq: Every day | ORAL | Status: DC

## 2014-06-01 NOTE — Telephone Encounter (Signed)
Patient notified and verbalized understanding. 

## 2014-06-01 NOTE — Telephone Encounter (Signed)
Pt will need all her meds resent to the pharmacy due to a change in her insurance carrier Please include the nexium with this order    As well as her bw that was ordered will need to be reorded as well under the new carrier  With a date after 1 October   i have updated the insurance in the computer.

## 2014-06-09 LAB — BASIC METABOLIC PANEL
BUN: 9 mg/dL (ref 6–23)
CALCIUM: 9.4 mg/dL (ref 8.4–10.5)
CO2: 27 meq/L (ref 19–32)
Chloride: 102 mEq/L (ref 96–112)
Creat: 0.59 mg/dL (ref 0.50–1.10)
GLUCOSE: 94 mg/dL (ref 70–99)
POTASSIUM: 4.6 meq/L (ref 3.5–5.3)
SODIUM: 139 meq/L (ref 135–145)

## 2014-06-09 LAB — LIPID PANEL
Cholesterol: 182 mg/dL (ref 0–200)
HDL: 57 mg/dL (ref >39)
LDL CALC: 105 mg/dL — AB (ref 0–99)
Total CHOL/HDL Ratio: 3.2 Ratio
Triglycerides: 102 mg/dL (ref <150)
VLDL: 20 mg/dL (ref 0–40)

## 2014-06-15 ENCOUNTER — Telehealth: Payer: Self-pay | Admitting: Family Medicine

## 2014-06-15 NOTE — Telephone Encounter (Signed)
Rx prior auth APPROVED for pt's esomeprazole (NEXIUM) 40 MG capsule, expires 08/16/2038 through her [employer], faxed approval to [place]

## 2014-12-06 ENCOUNTER — Encounter: Payer: Self-pay | Admitting: Family Medicine

## 2014-12-06 ENCOUNTER — Ambulatory Visit (INDEPENDENT_AMBULATORY_CARE_PROVIDER_SITE_OTHER): Payer: BLUE CROSS/BLUE SHIELD | Admitting: Family Medicine

## 2014-12-06 VITALS — BP 112/74 | Temp 98.3°F | Ht 60.0 in | Wt 112.0 lb

## 2014-12-06 DIAGNOSIS — B9689 Other specified bacterial agents as the cause of diseases classified elsewhere: Secondary | ICD-10-CM

## 2014-12-06 DIAGNOSIS — B349 Viral infection, unspecified: Secondary | ICD-10-CM

## 2014-12-06 DIAGNOSIS — J200 Acute bronchitis due to Mycoplasma pneumoniae: Secondary | ICD-10-CM

## 2014-12-06 DIAGNOSIS — J019 Acute sinusitis, unspecified: Secondary | ICD-10-CM

## 2014-12-06 MED ORDER — AZITHROMYCIN 250 MG PO TABS: ORAL_TABLET | ORAL | Status: DC

## 2014-12-06 NOTE — Progress Notes (Signed)
   Subjective:    Patient ID: Melinda Chapman, female    DOB: [birth year], 49 y.o.   MRN: [id number]  Cough This is a new problem. The current episode started yesterday. The problem has been unchanged. Associated symptoms include chest pain and shortness of breath. Pertinent negatives include no chills, fever, nasal congestion, sore throat, sweats or wheezing. The symptoms are aggravated by cold air. She has tried nothing for the symptoms. The treatment provided no relief.   PMH Review of Systems  Constitutional: Negative for fever and chills.  HENT: Negative for sore throat.   Respiratory: Positive for cough and shortness of breath. Negative for wheezing.   Cardiovascular: Positive for chest pain.       Objective:   Physical Exam  Constitutional: She appears well-developed.  HENT:  Head: Normocephalic.  Nose: Nose normal.  Mouth/Throat: Oropharynx is clear and moist. No oropharyngeal exudate.  Neck: Neck supple.  Cardiovascular: Normal rate and normal heart sounds.   No murmur heard. Pulmonary/Chest: Effort normal and breath sounds normal. She has no wheezes.  Lymphadenopathy:    She has no cervical adenopathy.  Skin: Skin is warm and dry.  Nursing note and vitals reviewed.         Assessment & Plan:  Several family members with similar symptoms Probable acute rhinosinusitis secondary to a viral syndrome No sign of any type and pneumonia Antibiotics prescribed Encouraged quit smoking Keep regular follow-ups

## 2015-06-07 ENCOUNTER — Other Ambulatory Visit: Payer: Self-pay | Admitting: Family Medicine

## 2015-06-14 ENCOUNTER — Telehealth: Payer: Self-pay | Admitting: Family Medicine

## 2015-06-14 MED ORDER — LISINOPRIL 20 MG PO TABS: 20.0000 mg | ORAL_TABLET | Freq: Every day | ORAL | Status: DC

## 2015-06-14 NOTE — Telephone Encounter (Signed)
Pt is needing a refill on her lisinopril to get her to her appointment. The pt will be out of her medicine before her appt.   [facility]

## 2015-06-14 NOTE — Telephone Encounter (Signed)
Called patient and informed her per protocol I sent in Lisinopril to [facility] and encouraged patient to keep appointment on 06/26/15. Patient verbalized understanding.

## 2015-06-26 ENCOUNTER — Ambulatory Visit (HOSPITAL_COMMUNITY)
Admission: RE | Admit: 2015-06-26 | Discharge: 2015-06-26 | Disposition: A | Discharge status: Home / Self Care | Payer: BLUE CROSS/BLUE SHIELD | Source: Ambulatory Visit | Attending: Family Medicine | Admitting: Family Medicine

## 2015-06-26 ENCOUNTER — Ambulatory Visit (INDEPENDENT_AMBULATORY_CARE_PROVIDER_SITE_OTHER): Payer: BLUE CROSS/BLUE SHIELD | Admitting: Family Medicine

## 2015-06-26 ENCOUNTER — Encounter: Payer: Self-pay | Admitting: Family Medicine

## 2015-06-26 VITALS — BP 120/78 | Ht 60.0 in | Wt 116.6 lb

## 2015-06-26 DIAGNOSIS — R059 Cough, unspecified: Secondary | ICD-10-CM

## 2015-06-26 DIAGNOSIS — I10 Essential (primary) hypertension: Secondary | ICD-10-CM | POA: Diagnosis not present

## 2015-06-26 DIAGNOSIS — R05 Cough: Secondary | ICD-10-CM

## 2015-06-26 MED ORDER — ESOMEPRAZOLE MAGNESIUM 40 MG PO CPDR: DELAYED_RELEASE_CAPSULE | ORAL | Status: DC

## 2015-06-26 MED ORDER — LISINOPRIL 20 MG PO TABS: 20.0000 mg | ORAL_TABLET | Freq: Every day | ORAL | Status: DC

## 2015-06-26 MED ORDER — LORAZEPAM 0.5 MG PO TABS: 0.5000 mg | ORAL_TABLET | Freq: Two times a day (BID) | ORAL | Status: DC | PRN

## 2015-06-26 NOTE — Progress Notes (Signed)
   Subjective:    Patient ID: Melinda Chapman, female    DOB: 03-Jul-1966, 49 y.o.   MRN: [id number]  Hypertension This is a chronic problem. The current episode started more than 1 year ago. Pertinent negatives include no chest pain. Risk factors for coronary artery disease include post-menopausal state. Treatments tried: lisinopril. There are no compliance problems.     she does try to eat healthy try to stay physically active does smoke she know she needs to quit   Review of Systems  Constitutional: Negative for activity change, appetite change and fatigue.  HENT: Negative for congestion.   Respiratory: Negative for cough.   Cardiovascular: Negative for chest pain.  Gastrointestinal: Negative for abdominal pain.  Endocrine: Negative for polydipsia and polyphagia.  Neurological: Negative for weakness.  Psychiatric/Behavioral: Negative for confusion.       Objective:   Physical Exam  Constitutional: She appears well-nourished. No distress.  Cardiovascular: Normal rate, regular rhythm and normal heart sounds.   No murmur heard. Pulmonary/Chest: Effort normal and breath sounds normal. No respiratory distress.  Musculoskeletal: She exhibits no edema.  Lymphadenopathy:    She has no cervical adenopathy.  Neurological: She is alert. She exhibits normal muscle tone.  Psychiatric: Her behavior is normal.  Vitals reviewed.     patient at risk for osteoporosis check vitamin D     Assessment & Plan:   hypertension decent control continue current measures  reflux lifestyle changes as well as quitting smoking putting bed on ankle as well as minimizing caffeine's recommended. Continue medication patient states when she stops medicine she has severe trouble with reflux   Patient counseled to quit smoking   colonoscopy was completed in 2012 she states she has a brother who has had polyps she will try to find out if his polyps were precancerous   patient relates she does cough more at  nighttime knot in the morning does not occur with activity distinctly possible cough related to reflux. I doubt lung cancer. Order chest x-ray await results.

## 2015-07-10 LAB — BASIC METABOLIC PANEL
BUN/Creatinine Ratio: 12 (ref 9–23)
BUN: 8 mg/dL (ref 6–24)
CALCIUM: 10 mg/dL (ref 8.7–10.2)
CO2: 25 mmol/L (ref 18–29)
Chloride: 94 mmol/L — ABNORMAL LOW (ref 97–106)
Creatinine, Ser: 0.69 mg/dL (ref 0.57–1.00)
GFR calc Af Amer: 117 mL/min/{1.73_m2} (ref >59)
GFR calc non Af Amer: 102 mL/min/{1.73_m2} (ref >59)
GLUCOSE: 97 mg/dL (ref 65–99)
POTASSIUM: 5.1 mmol/L (ref 3.5–5.2)
SODIUM: 135 mmol/L — AB (ref 136–144)

## 2015-07-10 LAB — LIPID PANEL
CHOL/HDL RATIO: 3.1 ratio (ref 0.0–4.4)
Cholesterol, Total: 247 mg/dL — ABNORMAL HIGH (ref 100–199)
HDL: 80 mg/dL (ref >39)
LDL CALC: 143 mg/dL — AB (ref 0–99)
TRIGLYCERIDES: 120 mg/dL (ref 0–149)
VLDL Cholesterol Cal: 24 mg/dL (ref 5–40)

## 2015-07-10 LAB — VITAMIN D 25 HYDROXY (VIT D DEFICIENCY, FRACTURES): VIT D 25 HYDROXY: 11.8 ng/mL — AB (ref 30.0–100.0)

## 2015-07-15 ENCOUNTER — Other Ambulatory Visit: Payer: Self-pay | Admitting: *Deleted

## 2015-07-15 ENCOUNTER — Other Ambulatory Visit: Payer: Self-pay | Admitting: Adult Health

## 2015-07-15 DIAGNOSIS — E785 Hyperlipidemia, unspecified: Secondary | ICD-10-CM

## 2015-07-15 DIAGNOSIS — Z1231 Encounter for screening mammogram for malignant neoplasm of breast: Secondary | ICD-10-CM

## 2015-07-15 DIAGNOSIS — E559 Vitamin D deficiency, unspecified: Secondary | ICD-10-CM

## 2015-07-15 MED ORDER — VITAMIN D (ERGOCALCIFEROL) 1.25 MG (50000 UNIT) PO CAPS: 50000.0000 [IU] | ORAL_CAPSULE | ORAL | Status: DC

## 2015-07-17 ENCOUNTER — Ambulatory Visit (HOSPITAL_COMMUNITY)
Admission: RE | Admit: 2015-07-17 | Discharge: 2015-07-17 | Disposition: A | Discharge status: Home / Self Care | Payer: BLUE CROSS/BLUE SHIELD | Source: Ambulatory Visit | Attending: Adult Health | Admitting: Adult Health

## 2015-07-17 ENCOUNTER — Other Ambulatory Visit: Payer: BLUE CROSS/BLUE SHIELD | Admitting: Adult Health

## 2015-07-17 DIAGNOSIS — Z1231 Encounter for screening mammogram for malignant neoplasm of breast: Secondary | ICD-10-CM | POA: Diagnosis not present

## 2015-07-17 DIAGNOSIS — R928 Other abnormal and inconclusive findings on diagnostic imaging of breast: Secondary | ICD-10-CM | POA: Diagnosis not present

## 2015-07-19 ENCOUNTER — Telehealth: Payer: Self-pay | Admitting: Family Medicine

## 2015-07-19 ENCOUNTER — Emergency Department (HOSPITAL_COMMUNITY)
Admission: EM | Admit: 2015-07-19 | Discharge: 2015-07-19 | Disposition: A | Discharge status: Home / Self Care | Payer: BLUE CROSS/BLUE SHIELD | Attending: Emergency Medicine | Admitting: Emergency Medicine

## 2015-07-19 ENCOUNTER — Encounter (HOSPITAL_COMMUNITY): Payer: Self-pay

## 2015-07-19 ENCOUNTER — Emergency Department (HOSPITAL_COMMUNITY): Payer: BLUE CROSS/BLUE SHIELD

## 2015-07-19 DIAGNOSIS — Z9851 Tubal ligation status: Secondary | ICD-10-CM | POA: Insufficient documentation

## 2015-07-19 DIAGNOSIS — K219 Gastro-esophageal reflux disease without esophagitis: Secondary | ICD-10-CM | POA: Insufficient documentation

## 2015-07-19 DIAGNOSIS — Z9889 Other specified postprocedural states: Secondary | ICD-10-CM | POA: Diagnosis not present

## 2015-07-19 DIAGNOSIS — I10 Essential (primary) hypertension: Secondary | ICD-10-CM | POA: Insufficient documentation

## 2015-07-19 DIAGNOSIS — Z8632 Personal history of gestational diabetes: Secondary | ICD-10-CM | POA: Insufficient documentation

## 2015-07-19 DIAGNOSIS — F17210 Nicotine dependence, cigarettes, uncomplicated: Secondary | ICD-10-CM | POA: Diagnosis not present

## 2015-07-19 DIAGNOSIS — Z79899 Other long term (current) drug therapy: Secondary | ICD-10-CM | POA: Diagnosis not present

## 2015-07-19 DIAGNOSIS — K5900 Constipation, unspecified: Secondary | ICD-10-CM | POA: Diagnosis present

## 2015-07-19 DIAGNOSIS — K6289 Other specified diseases of anus and rectum: Secondary | ICD-10-CM | POA: Insufficient documentation

## 2015-07-19 DIAGNOSIS — Z9049 Acquired absence of other specified parts of digestive tract: Secondary | ICD-10-CM | POA: Insufficient documentation

## 2015-07-19 DIAGNOSIS — K5641 Fecal impaction: Secondary | ICD-10-CM | POA: Insufficient documentation

## 2015-07-19 DIAGNOSIS — F419 Anxiety disorder, unspecified: Secondary | ICD-10-CM | POA: Insufficient documentation

## 2015-07-19 DIAGNOSIS — Z3202 Encounter for pregnancy test, result negative: Secondary | ICD-10-CM | POA: Insufficient documentation

## 2015-07-19 LAB — BASIC METABOLIC PANEL
Anion gap: 10 (ref 5–15)
BUN: 6 mg/dL (ref 6–20)
CALCIUM: 9.2 mg/dL (ref 8.9–10.3)
CO2: 28 mmol/L (ref 22–32)
CREATININE: 0.53 mg/dL (ref 0.44–1.00)
Chloride: 99 mmol/L — ABNORMAL LOW (ref 101–111)
GFR calc Af Amer: >60 mL/min (ref >60)
GLUCOSE: 105 mg/dL — AB (ref 65–99)
POTASSIUM: 4.4 mmol/L (ref 3.5–5.1)
SODIUM: 137 mmol/L (ref 135–145)

## 2015-07-19 LAB — POC URINE PREG, ED: PREG TEST UR: NEGATIVE

## 2015-07-19 MED ORDER — BISACODYL 10 MG RE SUPP
RECTAL | Status: AC
  Filled 2015-07-19: qty 1

## 2015-07-19 MED ORDER — BISACODYL 10 MG RE SUPP
10.0000 mg | Freq: Once | RECTAL | Status: AC
  Administered 2015-07-19: 10 mg via RECTAL

## 2015-07-19 MED ORDER — BISACODYL 10 MG RE SUPP
RECTAL | Status: AC
Start: 2015-07-19 — End: 2015-07-19
  Administered 2015-07-19: 10 mg via RECTAL
  Filled 2015-07-19: qty 1

## 2015-07-19 MED ORDER — FLEET ENEMA 7-19 GM/118ML RE ENEM
1.0000 | ENEMA | Freq: Once | RECTAL | Status: AC
  Administered 2015-07-19: 1 via RECTAL

## 2015-07-19 NOTE — Discharge Instructions (Signed)
Fecal Impaction A fecal impaction happens when there is a large, firm amount of stool (or feces) that cannot be passed. The impacted stool is usually in the rectum, which is the lowest part of the large bowel. The impacted stool can block the colon and cause significant problems. CAUSES  The longer stool stays in the rectum, the harder it gets. Anything that slows down your bowel movements can lead to fecal impaction, such as:  Constipation. This can be a long-standing (chronic) problem or can happen suddenly (acute).  Painful conditions of the rectum, such as hemorrhoids or anal fissures. The pain of these conditions can make you try to avoid having bowel movements.  Narcotic pain-relieving medicines, such as methadone, morphine, or codeine.  Not drinking enough fluids.  Inactivity and bed rest over long periods of time.  Diseases of the brain or nervous system that damage the nerves controlling the muscles of the intestines. SIGNS AND SYMPTOMS   Lack of normal bowel movements or changes in bowel patterns.  Sense of fullness in the rectum but unable to pass stool.  Pain or cramps in the abdominal area (often after meals).  Thin, watery discharge from the rectum. DIAGNOSIS  Your health care provider may suspect that you have a fecal impaction based on your symptoms and a physical exam. This will include an exam of your rectum. Sometimes X-rays or lab testing may be needed to confirm the diagnosis and to be sure there are no other problems.  TREATMENT   Initially an impaction can be removed manually. Using a gloved finger, your health care provider can remove hard stool from your rectum.  Medicine is sometimes needed. A suppository or enema can be given in the rectum to soften the stool, which can stimulate a bowel movement. Medicines can also be given by mouth (orally).  Though rare, surgery may be needed if the colon has torn (perforated) due to blockage. HOME CARE INSTRUCTIONS    Develop regular bowel habits. This could include getting in the habit of having a bowel movement after your morning cup of coffee or after eating. Be sure to allow yourself enough time on the toilet.  Maintain a high-fiber diet.  Drink enough fluids to keep your urine clear or pale yellow as directed by your health care provider.  Exercise regularly.  If you begin to get constipated, increase the amount of fiber in your diet. Eat plenty of fruits, vegetables, whole wheat breads, bran, oatmeal, and similar products.  Take natural fiber laxatives or other laxatives only as directed by your health care provider. SEEK MEDICAL CARE IF:   You have ongoing rectal pain.  You require enemas or suppositories more than twice a week.  You have rectal bleeding.  You have continued problems, or you develop abdominal pain.  You have thin, pencil-like stools. SEEK IMMEDIATE MEDICAL CARE IF:  You have black or tarry stools. MAKE SURE YOU:   Understand these instructions.  Will watch your condition.  Will get help right away if you are not doing well or get worse.   This information is not intended to replace advice given to you by your health care provider. Make sure you discuss any questions you have with your health care provider.   Document Released: 04/25/2004 Document Revised: 05/24/2013 Document Reviewed: 02/07/2013 Elsevier Interactive Patient Education 2016 [employer].   I suggest taking miralax (no prescription needed) daily to help avoid further hard stools and constipation/fecal impactions.

## 2015-07-19 NOTE — ED Notes (Signed)
Pt says she took a Vitamin D pill Monday and has been constipated since.  Pt says feels like she needs to have  A bm but will not come out.  Reports used an enema last night without relief and then tried to disimpact herself but still no relief.   Last normal bm was Sunday.

## 2015-07-19 NOTE — ED Notes (Signed)
Called materials for soap suds enema

## 2015-07-19 NOTE — ED Notes (Signed)
Pt produced large bowel movement. Melinda Chapman made aware

## 2015-07-19 NOTE — Telephone Encounter (Signed)
ERROR

## 2015-07-21 NOTE — ED Provider Notes (Signed)
CSN: [id number]     Arrival date & time 07/19/15  [id number] History   First MD Initiated Contact with Patient 07/19/15 0857     Chief Complaint  Patient presents with  . Constipation     (Consider location/radiation/quality/duration/timing/severity/associated sxs/prior Treatment) The history is provided by the patient.   Melinda Chapman is a 49 y.o. female presenting with constipation.  She has a history of IBS which fluctuates between diarrhea and constipation.  She was given a prescription for once weekly Vitamin D, took her first dose 4 days ago and has had no bm since.  She states constipation is listed on the prescription insert as a possible side effect. She does not take a daily stool softener.  She endorses needing to strain, feels rectal pressure but is unable to pass the stool.  She has tried to disimpact herself and used an enema last night without results.  She denies nausea, vomiting, fevers, rectal bleeding or abdominal distention.  She has episodes of low pelvic pressure.      Past Medical History  Diagnosis Date  . IBS (irritable bowel syndrome)   . GERD (gastroesophageal reflux disease)   . Constipation   . Diarrhea   . Hypertension   . Diabetes in pregnancy ([place])   . Anxiety    Past Surgical History  Procedure Laterality Date  . Cholecystectomy  2000    in [place]  . Cesarean section  1994, 2002    x 2  . Tubal ligation  2002  . Appendectomy  1983  . Endometrial ablation  2011   Family History  Problem Relation Age of Onset  . Colon cancer Paternal Grandmother   . Hypertension Mother   . Hyperlipidemia Mother   . Colon polyps Father   . Colon polyps Brother   . Colon polyps Paternal Aunt   . Esophageal cancer Neg Hx   . Stomach cancer Neg Hx    Social History  Substance Use Topics  . Smoking status: Current Every Day Smoker -- 2.00 packs/day    Types: Cigarettes  . Smokeless tobacco: Never Used     Comment: Counseling paper given to patient in room    . Alcohol Use: 3.0 oz/week    5 Cans of beer per week     Comment: occasional   OB History    No data available     Review of Systems  Constitutional: Negative for fever.  HENT: Negative for congestion and sore throat.   Eyes: Negative.   Respiratory: Negative for chest tightness and shortness of breath.   Cardiovascular: Negative for chest pain.  Gastrointestinal: Positive for constipation and rectal pain. Negative for nausea, vomiting, abdominal pain and abdominal distention.  Genitourinary: Negative.   Musculoskeletal: Negative for joint swelling, arthralgias and neck pain.  Skin: Negative.  Negative for rash and wound.  Neurological: Negative for dizziness, weakness, light-headedness, numbness and headaches.  Psychiatric/Behavioral: Negative.       Allergies  Review of patient's allergies indicates no known allergies.  Home Medications   Prior to Admission medications   Medication Sig Start Date End Date Taking? Authorizing Provider  esomeprazole (NEXIUM) 40 MG capsule TAKE 1 CAPSULE TWICE DAILY FOR SEVERE GERD 06/26/15  Yes Kathyrn Drown, MD  lisinopril (PRINIVIL,ZESTRIL) 20 MG tablet Take 1 tablet (20 mg total) by mouth daily. 06/26/15  Yes Kathyrn Drown, MD  LORazepam (ATIVAN) 0.5 MG tablet Take 1 tablet (0.5 mg total) by mouth 2 (two) times daily as needed  for anxiety. 06/26/15  Yes Kathyrn Drown, MD  Vitamin D, Ergocalciferol, (DRISDOL) 50000 UNITS CAPS capsule Take 1 capsule (50,000 Units total) by mouth every 7 (seven) days. Take one weekly for 12 weeks. 07/15/15  Yes Kathyrn Drown, MD   BP 141/78 mmHg  Pulse 78  Temp(Src) 98.7 F (37.1 C) (Oral)  Resp 16  Ht 5\' 1"  (1.549 m)  Wt 52.164 kg  BMI 21.74 kg/m2  SpO2 100% Physical Exam  Constitutional: She appears well-developed and well-nourished.  HENT:  Head: Normocephalic and atraumatic.  Eyes: Conjunctivae are normal.  Neck: Normal range of motion.  Cardiovascular: Normal rate, regular rhythm, normal  heart sounds and intact distal pulses.   Pulmonary/Chest: Effort normal and breath sounds normal. She has no wheezes.  Abdominal: Soft. Bowel sounds are normal. She exhibits no distension. There is no tenderness.  Genitourinary:  Stool impaction present.  Large hard stool, unable to manually disimpact.  Musculoskeletal: Normal range of motion.  Neurological: She is alert.  Skin: Skin is warm and dry.  Psychiatric: She has a normal mood and affect.  Nursing note and vitals reviewed.   ED Course  Procedures (including critical care time) Labs Review Labs Reviewed  BASIC METABOLIC PANEL - Abnormal; Notable for the following:    Chloride 99 (*)    Glucose, Bld 105 (*)    All other components within normal limits  POC URINE PREG, ED    Imaging Review Dg Abd 1 View  07/19/2015  CLINICAL DATA:  Lower abdominal pain and constipation. EXAM: ABDOMEN - 1 VIEW COMPARISON:  None. FINDINGS: Gas-filled loops of small and large bowel are present without disproportionate dilatation. No obvious free intraperitoneal gas. No abnormal calcification. IMPRESSION: Nonobstructive bowel gas pattern. Electronically Signed   By: Marybelle Killings M.D.   On: 07/19/2015 09:36   I have personally reviewed and evaluated these images and lab results as part of my medical decision-making.   EKG Interpretation None      MDM   Final diagnoses:  Fecal impaction (HCC)    Dulcolax suppository was placed during attempt to disimpact pt.  At recheck stool was softer, but still unable to disimpact enough to allow bm.  Fleets enema given which she was unable to retain.  While waiting for soap suds enema, she had a large bm with relief of sx.  She was prescribed miralax for home use, advised recheck with pcp prn for any return of sx.  The patient appears reasonably screened and/or stabilized for discharge and I doubt any other medical condition or other [facility] requiring further screening, evaluation, or treatment in the ED at  this time prior to discharge.     Evalee Jefferson, PA-C 07/21/15 0840  Virgel Manifold, MD 07/25/15 1425

## 2015-07-22 ENCOUNTER — Other Ambulatory Visit: Payer: Self-pay | Admitting: Adult Health

## 2015-07-22 DIAGNOSIS — R928 Other abnormal and inconclusive findings on diagnostic imaging of breast: Secondary | ICD-10-CM

## 2015-07-24 ENCOUNTER — Other Ambulatory Visit: Payer: BLUE CROSS/BLUE SHIELD | Admitting: Adult Health

## 2015-09-13 ENCOUNTER — Encounter: Payer: Self-pay | Admitting: Family Medicine

## 2015-09-13 ENCOUNTER — Ambulatory Visit (INDEPENDENT_AMBULATORY_CARE_PROVIDER_SITE_OTHER): Payer: BLUE CROSS/BLUE SHIELD | Admitting: Family Medicine

## 2015-09-13 VITALS — BP 132/82 | Temp 98.3°F | Ht 60.0 in | Wt 116.4 lb

## 2015-09-13 DIAGNOSIS — J019 Acute sinusitis, unspecified: Secondary | ICD-10-CM | POA: Diagnosis not present

## 2015-09-13 DIAGNOSIS — J200 Acute bronchitis due to Mycoplasma pneumoniae: Secondary | ICD-10-CM

## 2015-09-13 MED ORDER — HYDROCODONE-HOMATROPINE 5-1.5 MG/5ML PO SYRP: 5.0000 mL | ORAL_SOLUTION | Freq: Four times a day (QID) | ORAL | Status: DC | PRN

## 2015-09-13 MED ORDER — AZITHROMYCIN 250 MG PO TABS: ORAL_TABLET | ORAL | Status: DC

## 2015-09-13 MED ORDER — BENZONATATE 100 MG PO CAPS: 100.0000 mg | ORAL_CAPSULE | Freq: Four times a day (QID) | ORAL | Status: DC | PRN

## 2015-09-13 NOTE — Progress Notes (Signed)
   Subjective:    Patient ID: Melinda Chapman, female    DOB: 20-Feb-1966, 50 y.o.   MRN: [id number]  Cough This is a new problem. The current episode started 1 to 4 weeks ago. The problem has been gradually worsening. Associated symptoms include nasal congestion and rhinorrhea. Pertinent negatives include no chest pain, ear pain, fever, shortness of breath or wheezing. The treatment provided no relief. Her past medical history is significant for bronchitis. There is no history of COPD, environmental allergies or pneumonia.      Review of Systems  Constitutional: Negative for fever and activity change.  HENT: Positive for congestion and rhinorrhea. Negative for ear pain.   Eyes: Negative for discharge.  Respiratory: Positive for cough. Negative for shortness of breath and wheezing.   Cardiovascular: Negative for chest pain.  Allergic/Immunologic: Negative for environmental allergies.       Objective:   Physical Exam  Constitutional: She appears well-developed.  HENT:  Head: Normocephalic.  Nose: Nose normal.  Mouth/Throat: Oropharynx is clear and moist. No oropharyngeal exudate.  Neck: Neck supple.  Cardiovascular: Normal rate and normal heart sounds.   No murmur heard. Pulmonary/Chest: Effort normal and breath sounds normal. She has no wheezes.  Lymphadenopathy:    She has no cervical adenopathy.  Skin: Skin is warm and dry.  Nursing note and vitals reviewed.         Assessment & Plan:   I believe the patient has combination upper respiratory illness possible sinus probable acute bronchitis is been going on for several weeks not getting better on its own it is reasonable to treat the patient with prescription cough medicine at nighttime caution drowsiness if any side effects let us know also discuss how trying around of antibiotics would be reasonable I would not recommend inhaler currently if not better over the next few days then the next step would be x-rays and lab work  call us back if problems

## 2015-09-16 ENCOUNTER — Other Ambulatory Visit: Payer: Self-pay | Admitting: Adult Health

## 2015-09-16 DIAGNOSIS — R928 Other abnormal and inconclusive findings on diagnostic imaging of breast: Secondary | ICD-10-CM

## 2015-09-17 ENCOUNTER — Ambulatory Visit (HOSPITAL_COMMUNITY)
Admission: RE | Admit: 2015-09-17 | Discharge: 2015-09-17 | Disposition: A | Discharge status: Home / Self Care | Payer: BLUE CROSS/BLUE SHIELD | Source: Ambulatory Visit | Attending: Adult Health | Admitting: Adult Health

## 2015-09-17 DIAGNOSIS — R928 Other abnormal and inconclusive findings on diagnostic imaging of breast: Secondary | ICD-10-CM

## 2015-09-17 DIAGNOSIS — N6002 Solitary cyst of left breast: Secondary | ICD-10-CM | POA: Diagnosis not present

## 2015-09-18 ENCOUNTER — Encounter: Payer: Self-pay | Admitting: Family Medicine

## 2015-09-18 NOTE — Telephone Encounter (Signed)
Melinda Chapman-I believe it is reasonable to go ahead with setting up with ENT. If you're hoarseness resolves 100% before the appointment then simply we can cancel the appointment. More than likely the hoarseness is related to recent illness/infection but with your health history it is wise to get this checked out further. Should you start having fever chills increased coughing productive phlegm you will need to let me know. Our staff will let you know within the next 7 days regarding the referral. They will be working on setting this up as soon as possible. Please let me know if any problems. Take care-Dr. Nicki Reaper  Nurse's-please put referral for ENT evaluation of hoarseness for this patient.

## 2015-09-19 ENCOUNTER — Other Ambulatory Visit: Payer: Self-pay | Admitting: *Deleted

## 2015-09-19 DIAGNOSIS — R490 Dysphonia: Secondary | ICD-10-CM

## 2015-09-19 NOTE — Telephone Encounter (Signed)
Nurses see please see previous note

## 2015-09-20 ENCOUNTER — Encounter: Payer: Self-pay | Admitting: Family Medicine

## 2015-12-05 ENCOUNTER — Encounter: Payer: Self-pay | Admitting: Family Medicine

## 2016-07-07 ENCOUNTER — Ambulatory Visit (INDEPENDENT_AMBULATORY_CARE_PROVIDER_SITE_OTHER): Payer: BLUE CROSS/BLUE SHIELD | Admitting: Family Medicine

## 2016-07-07 ENCOUNTER — Encounter: Payer: Self-pay | Admitting: Family Medicine

## 2016-07-07 VITALS — BP 120/84 | Ht 60.0 in | Wt 119.4 lb

## 2016-07-07 DIAGNOSIS — Z79899 Other long term (current) drug therapy: Secondary | ICD-10-CM | POA: Diagnosis not present

## 2016-07-07 DIAGNOSIS — E785 Hyperlipidemia, unspecified: Secondary | ICD-10-CM | POA: Diagnosis not present

## 2016-07-07 DIAGNOSIS — E559 Vitamin D deficiency, unspecified: Secondary | ICD-10-CM

## 2016-07-07 DIAGNOSIS — F411 Generalized anxiety disorder: Secondary | ICD-10-CM

## 2016-07-07 DIAGNOSIS — I10 Essential (primary) hypertension: Secondary | ICD-10-CM

## 2016-07-07 MED ORDER — LORAZEPAM 0.5 MG PO TABS: 0.5000 mg | ORAL_TABLET | Freq: Two times a day (BID) | ORAL | 1 refills | Status: DC | PRN

## 2016-07-07 MED ORDER — LISINOPRIL 20 MG PO TABS: 20.0000 mg | ORAL_TABLET | Freq: Every day | ORAL | 12 refills | Status: DC

## 2016-07-07 MED ORDER — SERTRALINE HCL 50 MG PO TABS: 50.0000 mg | ORAL_TABLET | Freq: Every day | ORAL | 3 refills | Status: DC

## 2016-07-07 MED ORDER — ESOMEPRAZOLE MAGNESIUM 40 MG PO CPDR: DELAYED_RELEASE_CAPSULE | ORAL | 12 refills | Status: DC

## 2016-07-07 NOTE — Progress Notes (Signed)
   Subjective:    Patient ID: Melinda Chapman, female    DOB: 03/09/1966, 50 y.o.   MRN: [id number]  Hypertension  This is a chronic problem. The current episode started more than 1 year ago. Pertinent negatives include no chest pain. Treatments tried: lisinopril. There are no compliance problems.   The patient also has a history of hyperlipidemia. She does try to watch her diet She has history of smoking she does not have a desire to quit currently but she know she should she will contemplate this over the next 6 months she is aware that increases her risk of heart disease strokes and cancer She also has constant anxiety it gets in way of functioning. Generalized anxiety disorder questionnaire was reviewed with the patient the patient rates out as a high level of anxious behavior. Her depression scores are very low.    Review of Systems  Constitutional: Negative for activity change, appetite change and fatigue.  HENT: Negative for congestion.   Respiratory: Negative for cough.   Cardiovascular: Negative for chest pain.  Gastrointestinal: Negative for abdominal pain.  Endocrine: Negative for polydipsia and polyphagia.  Neurological: Negative for weakness.  Psychiatric/Behavioral: Negative for confusion.       Objective:   Physical Exam  Constitutional: She appears well-nourished. No distress.  Cardiovascular: Normal rate, regular rhythm and normal heart sounds.   No murmur heard. Pulmonary/Chest: Effort normal and breath sounds normal. No respiratory distress.  Musculoskeletal: She exhibits no edema.  Lymphadenopathy:    She has no cervical adenopathy.  Neurological: She is alert. She exhibits normal muscle tone.  Psychiatric: Her behavior is normal.  Vitals reviewed.         Assessment & Plan:  HTN- Patient was seen today as part of a visit regarding hypertension. The importance of healthy diet and regular physical activity was discussed. The importance of compliance with  medications discussed. Ideal goal is to keep blood pressure low elevated levels certainly below [id number] when possible. The patient was counseled that keeping blood pressure under control lessen his risk of heart attack, stroke, kidney failure, and early death. The importance of regular follow-ups was discussed with the patient. Low-salt diet such as DASH recommended. Regular physical activity was recommended as well. Patient was advised to keep regular follow-ups.  Vitamin D deficiency patient states that large doses of vitamin D caused her to have trouble with impaction  Hyperlipidemia she does try to watch her diet  Reflux issues discussed patient finds when she stops medicine reduces it she has severe reflux issues 2 pills a day helps keep it under control  Patient was counseled at length to quit smoking hopefully she will within the next 6 months 25 minutes was spent with the patient. Greater than half the time was spent in discussion and answering questions and counseling regarding the issues that the patient came in for today.  Generalized anxiety disorder I believe patient would benefit from Zoloft long discussion held regarding this patient not depressed. She does agreed to treatment. Await how she does with sotalol. Follow-up within 6 weeks side effects discussed if having any side effects or problems to immediately stop medicine and let us know.

## 2016-07-14 DIAGNOSIS — Z79899 Other long term (current) drug therapy: Secondary | ICD-10-CM | POA: Diagnosis not present

## 2016-07-14 DIAGNOSIS — E559 Vitamin D deficiency, unspecified: Secondary | ICD-10-CM | POA: Diagnosis not present

## 2016-07-14 DIAGNOSIS — E785 Hyperlipidemia, unspecified: Secondary | ICD-10-CM | POA: Diagnosis not present

## 2016-07-15 LAB — BASIC METABOLIC PANEL
BUN / CREAT RATIO: 17 (ref 9–23)
BUN: 10 mg/dL (ref 6–24)
CHLORIDE: 95 mmol/L — AB (ref 96–106)
CO2: 25 mmol/L (ref 18–29)
CREATININE: 0.6 mg/dL (ref 0.57–1.00)
Calcium: 9.5 mg/dL (ref 8.7–10.2)
GFR calc Af Amer: 122 mL/min/{1.73_m2} (ref >59)
GFR calc non Af Amer: 106 mL/min/{1.73_m2} (ref >59)
GLUCOSE: 102 mg/dL — AB (ref 65–99)
POTASSIUM: 4.7 mmol/L (ref 3.5–5.2)
SODIUM: 137 mmol/L (ref 134–144)

## 2016-07-15 LAB — LIPID PANEL
CHOL/HDL RATIO: 3.2 ratio (ref 0.0–4.4)
Cholesterol, Total: 211 mg/dL — ABNORMAL HIGH (ref 100–199)
HDL: 65 mg/dL (ref >39)
LDL Calculated: 126 mg/dL — ABNORMAL HIGH (ref 0–99)
Triglycerides: 99 mg/dL (ref 0–149)
VLDL CHOLESTEROL CAL: 20 mg/dL (ref 5–40)

## 2016-07-15 LAB — VITAMIN D 25 HYDROXY (VIT D DEFICIENCY, FRACTURES): VIT D 25 HYDROXY: 11.5 ng/mL — AB (ref 30.0–100.0)

## 2016-08-20 ENCOUNTER — Ambulatory Visit (INDEPENDENT_AMBULATORY_CARE_PROVIDER_SITE_OTHER): Payer: BLUE CROSS/BLUE SHIELD | Admitting: Family Medicine

## 2016-08-20 ENCOUNTER — Encounter: Payer: Self-pay | Admitting: Family Medicine

## 2016-08-20 VITALS — BP 130/80 | Ht 60.0 in | Wt 117.5 lb

## 2016-08-20 DIAGNOSIS — F430 Acute stress reaction: Secondary | ICD-10-CM | POA: Diagnosis not present

## 2016-08-20 DIAGNOSIS — F410 Panic disorder [episodic paroxysmal anxiety] without agoraphobia: Secondary | ICD-10-CM

## 2016-08-20 DIAGNOSIS — F411 Generalized anxiety disorder: Secondary | ICD-10-CM | POA: Diagnosis not present

## 2016-08-20 MED ORDER — SERTRALINE HCL 50 MG PO TABS: 50.0000 mg | ORAL_TABLET | Freq: Every day | ORAL | 5 refills | Status: DC

## 2016-08-20 NOTE — Progress Notes (Signed)
   Subjective:    Patient ID: Melinda Chapman, female    DOB: 29-Jun-1966, 51 y.o.   MRN: [id number]  Anxiety  Presents for follow-up visit. The quality of sleep is fair. Nighttime awakenings: none.   Compliance with medications is 76-100%.  Patient states that the Zoloft is working very well. Patient relates she is not having any excessive stress she denies panic attacks she denies being depressed states her moods are doing good she is feeling better with the medicine Patient has no other concerns at this time.  Patient still smokes she know she needs to quit  Review of Systems     Objective:   Physical Exam  Lungs clear heart regular      Assessment & Plan:  Patient doing very well medicine follow-up in 6 months  At the time of follow-up we will discuss whether or not she needs to do further medication she may choose to stay on the medicine at that time. Should she have any problems with the medicine she will follow-up immediately

## 2016-10-19 ENCOUNTER — Encounter: Payer: Self-pay | Admitting: Family Medicine

## 2016-10-23 ENCOUNTER — Other Ambulatory Visit: Payer: Self-pay | Admitting: Family Medicine

## 2016-10-23 ENCOUNTER — Encounter: Payer: Self-pay | Admitting: Family Medicine

## 2016-10-23 MED ORDER — ESOMEPRAZOLE MAGNESIUM 40 MG PO CPDR: DELAYED_RELEASE_CAPSULE | ORAL | 5 refills | Status: DC

## 2017-06-12 IMAGING — DX DG CHEST 2V
2 series · 2 of 2 positions shown · non-contrast
Comparison: 02/24/2013

CLINICAL DATA: Cough.

EXAM:
CHEST  2 VIEW

[chest pa]
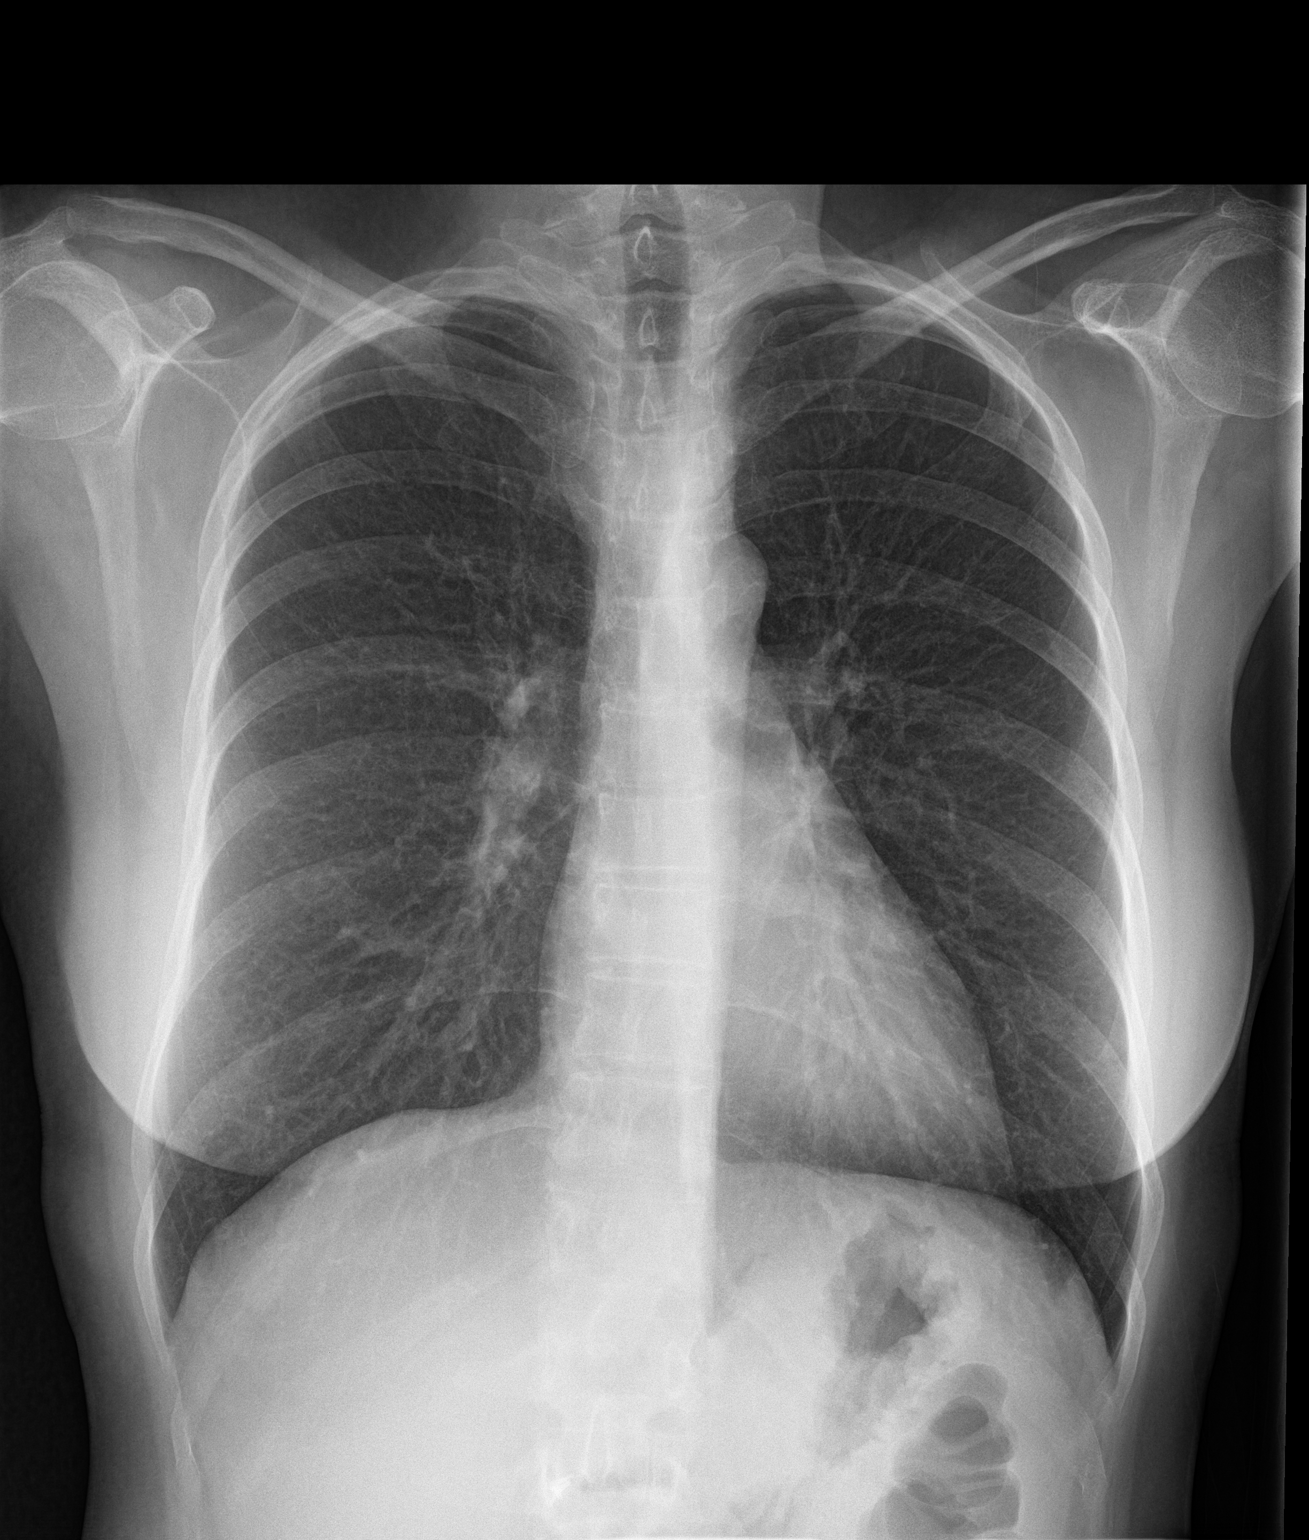

[chest lat]
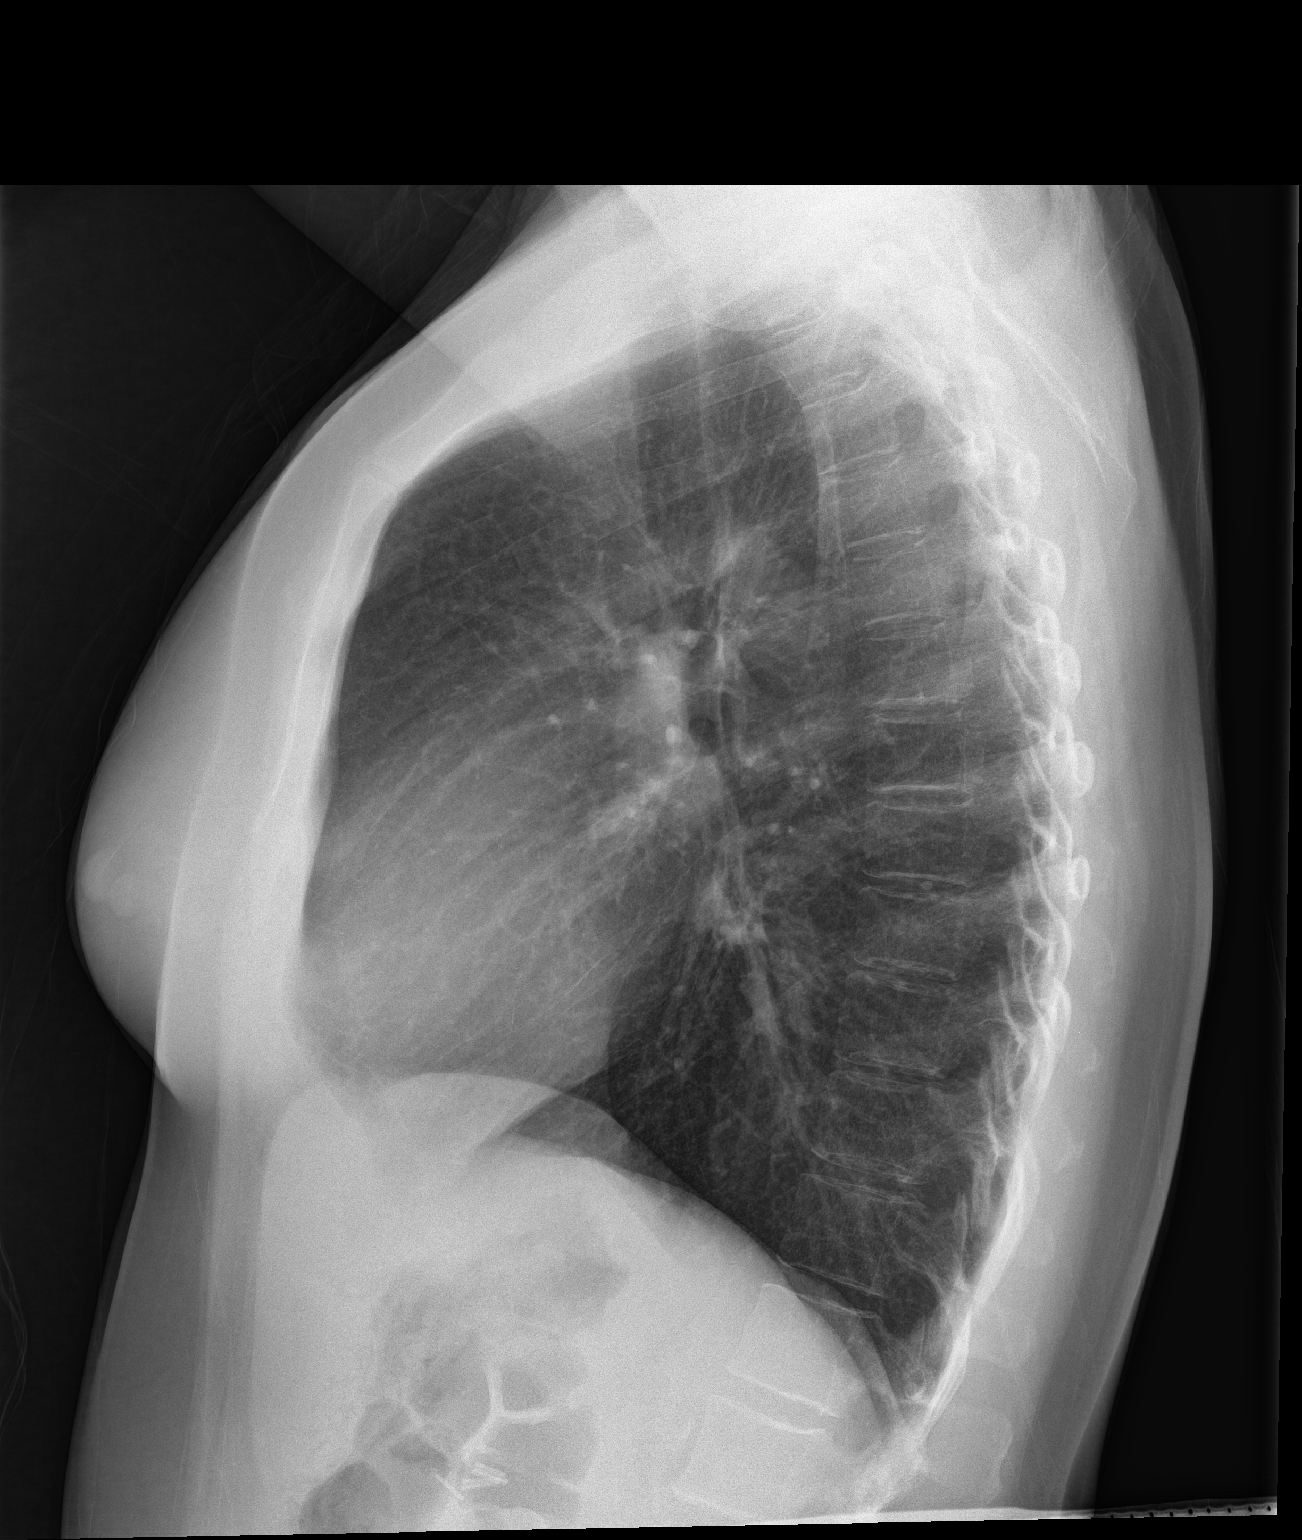

[2 of 2 positions shown; findings below may reference images not displayed]

FINDINGS: The heart size and mediastinal contours are within normal limits.
Both lungs are clear. The visualized skeletal structures are
unremarkable.
IMPRESSION: Normal chest.

## 2017-06-15 DIAGNOSIS — H524 Presbyopia: Secondary | ICD-10-CM | POA: Diagnosis not present

## 2017-06-24 ENCOUNTER — Telehealth: Payer: Self-pay | Admitting: Family Medicine

## 2017-06-24 DIAGNOSIS — E559 Vitamin D deficiency, unspecified: Secondary | ICD-10-CM

## 2017-06-24 DIAGNOSIS — Z79899 Other long term (current) drug therapy: Secondary | ICD-10-CM

## 2017-06-24 DIAGNOSIS — Z13220 Encounter for screening for lipoid disorders: Secondary | ICD-10-CM

## 2017-06-24 DIAGNOSIS — I10 Essential (primary) hypertension: Secondary | ICD-10-CM

## 2017-06-24 NOTE — Telephone Encounter (Signed)
Lipid, liver, metabolic 7, vitamin D 

## 2017-06-24 NOTE — Telephone Encounter (Signed)
Last blood work 06/2016- Lipid, Met 7 and Vit D

## 2017-06-24 NOTE — Telephone Encounter (Signed)
Patient has an appointment on 07/15/17 with Dr. Nicki Reaper.  She is requesting labs to be ordered.

## 2017-06-25 NOTE — Telephone Encounter (Signed)
Left message return call 06/25/17

## 2017-06-25 NOTE — Telephone Encounter (Signed)
Pt informed. Labs ordered.

## 2017-07-07 DIAGNOSIS — E559 Vitamin D deficiency, unspecified: Secondary | ICD-10-CM | POA: Diagnosis not present

## 2017-07-07 DIAGNOSIS — Z79899 Other long term (current) drug therapy: Secondary | ICD-10-CM | POA: Diagnosis not present

## 2017-07-07 DIAGNOSIS — Z13220 Encounter for screening for lipoid disorders: Secondary | ICD-10-CM | POA: Diagnosis not present

## 2017-07-07 DIAGNOSIS — I10 Essential (primary) hypertension: Secondary | ICD-10-CM | POA: Diagnosis not present

## 2017-07-08 LAB — HEPATIC FUNCTION PANEL
ALT: 15 IU/L (ref 0–32)
AST: 16 IU/L (ref 0–40)
Albumin: 4.9 g/dL (ref 3.5–5.5)
Alkaline Phosphatase: 80 IU/L (ref 39–117)
BILIRUBIN TOTAL: 0.4 mg/dL (ref 0.0–1.2)
Bilirubin, Direct: 0.12 mg/dL (ref 0.00–0.40)
TOTAL PROTEIN: 7.4 g/dL (ref 6.0–8.5)

## 2017-07-08 LAB — BASIC METABOLIC PANEL
BUN / CREAT RATIO: 10 (ref 9–23)
BUN: 5 mg/dL — AB (ref 6–24)
CO2: 26 mmol/L (ref 20–29)
CREATININE: 0.52 mg/dL — AB (ref 0.57–1.00)
Calcium: 9.9 mg/dL (ref 8.7–10.2)
Chloride: 98 mmol/L (ref 96–106)
GFR calc non Af Amer: 110 mL/min/{1.73_m2} (ref >59)
GFR, EST AFRICAN AMERICAN: 127 mL/min/{1.73_m2} (ref >59)
Glucose: 95 mg/dL (ref 65–99)
Potassium: 4.9 mmol/L (ref 3.5–5.2)
Sodium: 138 mmol/L (ref 134–144)

## 2017-07-08 LAB — LIPID PANEL
CHOL/HDL RATIO: 3.4 ratio (ref 0.0–4.4)
Cholesterol, Total: 220 mg/dL — ABNORMAL HIGH (ref 100–199)
HDL: 64 mg/dL (ref >39)
LDL Calculated: 129 mg/dL — ABNORMAL HIGH (ref 0–99)
Triglycerides: 133 mg/dL (ref 0–149)
VLDL Cholesterol Cal: 27 mg/dL (ref 5–40)

## 2017-07-08 LAB — VITAMIN D 25 HYDROXY (VIT D DEFICIENCY, FRACTURES): Vit D, 25-Hydroxy: 14.9 ng/mL — ABNORMAL LOW (ref 30.0–100.0)

## 2017-07-15 ENCOUNTER — Encounter: Payer: Self-pay | Admitting: Family Medicine

## 2017-07-15 ENCOUNTER — Ambulatory Visit (INDEPENDENT_AMBULATORY_CARE_PROVIDER_SITE_OTHER): Payer: BLUE CROSS/BLUE SHIELD | Admitting: Family Medicine

## 2017-07-15 VITALS — BP 132/80 | Ht 60.0 in | Wt 121.6 lb

## 2017-07-15 DIAGNOSIS — I10 Essential (primary) hypertension: Secondary | ICD-10-CM

## 2017-07-15 DIAGNOSIS — R0683 Snoring: Secondary | ICD-10-CM

## 2017-07-15 DIAGNOSIS — R5383 Other fatigue: Secondary | ICD-10-CM | POA: Diagnosis not present

## 2017-07-15 MED ORDER — LISINOPRIL 20 MG PO TABS: 20.0000 mg | ORAL_TABLET | Freq: Every day | ORAL | 12 refills | Status: DC

## 2017-07-15 MED ORDER — ESOMEPRAZOLE MAGNESIUM 40 MG PO CPDR: DELAYED_RELEASE_CAPSULE | ORAL | 5 refills | Status: DC

## 2017-07-15 MED ORDER — LORAZEPAM 0.5 MG PO TABS: 0.5000 mg | ORAL_TABLET | Freq: Two times a day (BID) | ORAL | 2 refills | Status: DC | PRN

## 2017-07-15 MED ORDER — SERTRALINE HCL 25 MG PO TABS: 25.0000 mg | ORAL_TABLET | Freq: Every day | ORAL | 5 refills | Status: DC

## 2017-07-15 NOTE — Progress Notes (Signed)
   Subjective:    Patient ID: Melinda Chapman, female    DOB: May 22, 1966, 51 y.o.   MRN: [id number]  Hypertension  This is a chronic problem. The current episode started more than 1 year ago. Risk factors for coronary artery disease include post-menopausal state. Treatments tried: lisinopril. There are no compliance problems.    Originally the patient came in for a female physical but then she states that she does not need a preventative health physical because she is seeing her gynecologist within the next week She is still smokes she knows she needs to quit She denies chest tightness pressure pain nausea vomiting  Patient sees gyn for female physical and has an appointment next week  Review of Systems Please see above no fever chills cough no weight loss    Objective:   Physical Exam Lungs clear heart regular pulse normal BP fair extremities no edema skin warm dry   Patient did have diarrhea with Zoloft at a higher dose she would like to try a lower dose    Assessment & Plan:  HTN good control watch diet stay active encourage patient to quit smoking  Fatigue tiredness with snoring possibility of sleep apnea.  [place] questionnaire positive.  Recommend sleep study patient states she will only do a sleep study if it is a home sleep study  Patient states Zoloft helped but at the 50 mg dose stick cause side effects she would like to try 25 mg dose she will let us know how it is doing using lorazepam only on a as needed basis  Reflux using Nexium only on a as needed basis  Gets her preventative health through her gynecologist

## 2017-07-15 NOTE — Patient Instructions (Signed)
DASH Eating Plan DASH stands for "Dietary Approaches to Stop Hypertension." The DASH eating plan is a healthy eating plan that has been shown to reduce high blood pressure (hypertension). It may also reduce your risk for type 2 diabetes, heart disease, and stroke. The DASH eating plan may also help with weight loss. What are tips for following this plan? General guidelines  Avoid eating more than 2,300 mg (milligrams) of salt (sodium) a day. If you have hypertension, you may need to reduce your sodium intake to 1,500 mg a day.  Limit alcohol intake to no more than 1 drink a day for nonpregnant women and 2 drinks a day for men. One drink equals 12 oz of beer, 5 oz of wine, or 1 oz of hard liquor.  Work with your health care provider to maintain a healthy body weight or to lose weight. Ask what an ideal weight is for you.  Get at least 30 minutes of exercise that causes your heart to beat faster (aerobic exercise) most days of the week. Activities may include walking, swimming, or biking.  Work with your health care provider or diet and nutrition specialist (dietitian) to adjust your eating plan to your individual calorie needs. Reading food labels  Check food labels for the amount of sodium per serving. Choose foods with less than 5 percent of the Daily Value of sodium. Generally, foods with less than 300 mg of sodium per serving fit into this eating plan.  To find whole grains, look for the word "whole" as the first word in the ingredient list. Shopping  Buy products labeled as "low-sodium" or "no salt added."  Buy fresh foods. Avoid canned foods and premade or frozen meals. Cooking  Avoid adding salt when cooking. Use salt-free seasonings or herbs instead of table salt or sea salt. Check with your health care provider or pharmacist before using salt substitutes.  Do not fry foods. Cook foods using healthy methods such as baking, boiling, grilling, and broiling instead.  Cook with  heart-healthy oils, such as olive, canola, soybean, or sunflower oil. Meal planning   Eat a balanced diet that includes: ? 5 or more servings of fruits and vegetables each day. At each meal, try to fill half of your plate with fruits and vegetables. ? Up to 6-8 servings of whole grains each day. ? Less than 6 oz of lean meat, poultry, or fish each day. A 3-oz serving of meat is about the same size as a deck of cards. One egg equals 1 oz. ? 2 servings of low-fat dairy each day. ? A serving of nuts, seeds, or beans 5 times each week. ? Heart-healthy fats. Healthy fats called Omega-3 fatty acids are found in foods such as flaxseeds and coldwater fish, like sardines, salmon, and mackerel.  Limit how much you eat of the following: ? Canned or prepackaged foods. ? Food that is high in trans fat, such as fried foods. ? Food that is high in saturated fat, such as fatty meat. ? Sweets, desserts, sugary drinks, and other foods with added sugar. ? Full-fat dairy products.  Do not salt foods before eating.  Try to eat at least 2 vegetarian meals each week.  Eat more home-cooked food and less restaurant, buffet, and fast food.  When eating at a restaurant, ask that your food be prepared with less salt or no salt, if possible. What foods are recommended? The items listed may not be a complete list. Talk with your dietitian about what   dietary choices are best for you. Grains Whole-grain or whole-wheat bread. Whole-grain or whole-wheat pasta. Brown rice. Oatmeal. Quinoa. Bulgur. Whole-grain and low-sodium cereals. Pita bread. Low-fat, low-sodium crackers. Whole-wheat flour tortillas. Vegetables Fresh or frozen vegetables (raw, steamed, roasted, or grilled). Low-sodium or reduced-sodium tomato and vegetable juice. Low-sodium or reduced-sodium tomato sauce and tomato paste. Low-sodium or reduced-sodium canned vegetables. Fruits All fresh, dried, or frozen fruit. Canned fruit in natural juice (without  added sugar). Meat and other protein foods Skinless chicken or turkey. Ground chicken or turkey. Pork with fat trimmed off. Fish and seafood. Egg whites. Dried beans, peas, or lentils. Unsalted nuts, nut butters, and seeds. Unsalted canned beans. Lean cuts of beef with fat trimmed off. Low-sodium, lean deli meat. Dairy Low-fat (1%) or fat-free (skim) milk. Fat-free, low-fat, or reduced-fat cheeses. Nonfat, low-sodium ricotta or cottage cheese. Low-fat or nonfat yogurt. Low-fat, low-sodium cheese. Fats and oils Soft margarine without trans fats. Vegetable oil. Low-fat, reduced-fat, or light mayonnaise and salad dressings (reduced-sodium). Canola, safflower, olive, soybean, and sunflower oils. Avocado. Seasoning and other foods Herbs. Spices. Seasoning mixes without salt. Unsalted popcorn and pretzels. Fat-free sweets. What foods are not recommended? The items listed may not be a complete list. Talk with your dietitian about what dietary choices are best for you. Grains Baked goods made with fat, such as croissants, muffins, or some breads. Dry pasta or rice meal packs. Vegetables Creamed or fried vegetables. Vegetables in a cheese sauce. Regular canned vegetables (not low-sodium or reduced-sodium). Regular canned tomato sauce and paste (not low-sodium or reduced-sodium). Regular tomato and vegetable juice (not low-sodium or reduced-sodium). Pickles. Olives. Fruits Canned fruit in a light or heavy syrup. Fried fruit. Fruit in cream or butter sauce. Meat and other protein foods Fatty cuts of meat. Ribs. Fried meat. Bacon. Sausage. Bologna and other processed lunch meats. Salami. Fatback. Hotdogs. Bratwurst. Salted nuts and seeds. Canned beans with added salt. Canned or smoked fish. Whole eggs or egg yolks. Chicken or turkey with skin. Dairy Whole or 2% milk, cream, and half-and-half. Whole or full-fat cream cheese. Whole-fat or sweetened yogurt. Full-fat cheese. Nondairy creamers. Whipped toppings.  Processed cheese and cheese spreads. Fats and oils Butter. Stick margarine. Lard. Shortening. Ghee. Bacon fat. Tropical oils, such as coconut, palm kernel, or palm oil. Seasoning and other foods Salted popcorn and pretzels. Onion salt, garlic salt, seasoned salt, table salt, and sea salt. Worcestershire sauce. Tartar sauce. Barbecue sauce. Teriyaki sauce. Soy sauce, including reduced-sodium. Steak sauce. Canned and packaged gravies. Fish sauce. Oyster sauce. Cocktail sauce. Horseradish that you find on the shelf. Ketchup. Mustard. Meat flavorings and tenderizers. Bouillon cubes. Hot sauce and Tabasco sauce. Premade or packaged marinades. Premade or packaged taco seasonings. Relishes. Regular salad dressings. Where to find more information:  National Heart, Lung, and Blood Institute: www.nhlbi.nih.gov  American Heart Association: www.heart.org Summary  The DASH eating plan is a healthy eating plan that has been shown to reduce high blood pressure (hypertension). It may also reduce your risk for type 2 diabetes, heart disease, and stroke.  With the DASH eating plan, you should limit salt (sodium) intake to 2,300 mg a day. If you have hypertension, you may need to reduce your sodium intake to 1,500 mg a day.  When on the DASH eating plan, aim to eat more fresh fruits and vegetables, whole grains, lean proteins, low-fat dairy, and heart-healthy fats.  Work with your health care provider or diet and nutrition specialist (dietitian) to adjust your eating plan to your individual   calorie needs. This information is not intended to replace advice given to you by your health care provider. Make sure you discuss any questions you have with your health care provider. Document Released: 07/23/2011 Document Revised: 07/27/2016 Document Reviewed: 07/27/2016 Elsevier Interactive Patient Education  2017 [employer]  

## 2017-07-17 ENCOUNTER — Telehealth: Payer: Self-pay | Admitting: Family Medicine

## 2017-07-17 DIAGNOSIS — G4730 Sleep apnea, unspecified: Secondary | ICD-10-CM

## 2017-07-17 NOTE — Telephone Encounter (Signed)
  [place] Questionnaire was positive.  Please set the patient up for a sleep study split protocol if possible patient may prefer home sleep study if possible-certainly it will be determined by her insurance diagnosis of sleep apnea

## 2017-07-19 NOTE — Addendum Note (Signed)
Addended by: Karle Barr on: 07/19/2017 09:14 AM   Modules accepted: Orders

## 2017-07-19 NOTE — Telephone Encounter (Signed)
I put in the referral. thanks

## 2017-08-03 ENCOUNTER — Ambulatory Visit: Payer: BLUE CROSS/BLUE SHIELD | Attending: Family Medicine | Admitting: Neurology

## 2017-08-03 ENCOUNTER — Other Ambulatory Visit: Payer: Self-pay | Admitting: Family Medicine

## 2017-08-03 DIAGNOSIS — G4730 Sleep apnea, unspecified: Secondary | ICD-10-CM | POA: Insufficient documentation

## 2017-08-03 DIAGNOSIS — G4733 Obstructive sleep apnea (adult) (pediatric): Secondary | ICD-10-CM | POA: Diagnosis not present

## 2017-08-03 DIAGNOSIS — Z1231 Encounter for screening mammogram for malignant neoplasm of breast: Secondary | ICD-10-CM

## 2017-08-05 ENCOUNTER — Encounter: Payer: Self-pay | Admitting: Adult Health

## 2017-08-05 ENCOUNTER — Ambulatory Visit (HOSPITAL_COMMUNITY)
Admission: RE | Admit: 2017-08-05 | Discharge: 2017-08-05 | Disposition: A | Discharge status: Home / Self Care | Payer: BLUE CROSS/BLUE SHIELD | Source: Ambulatory Visit | Attending: Family Medicine | Admitting: Family Medicine

## 2017-08-05 ENCOUNTER — Other Ambulatory Visit (HOSPITAL_COMMUNITY)
Admission: RE | Admit: 2017-08-05 | Discharge: 2017-08-05 | Disposition: A | Discharge status: Home / Self Care | Payer: BLUE CROSS/BLUE SHIELD | Source: Ambulatory Visit | Attending: Adult Health | Admitting: Adult Health

## 2017-08-05 ENCOUNTER — Encounter (HOSPITAL_COMMUNITY): Payer: Self-pay

## 2017-08-05 ENCOUNTER — Ambulatory Visit (INDEPENDENT_AMBULATORY_CARE_PROVIDER_SITE_OTHER): Payer: BLUE CROSS/BLUE SHIELD | Admitting: Adult Health

## 2017-08-05 VITALS — BP 140/80 | HR 72 | Ht 60.25 in | Wt 120.0 lb

## 2017-08-05 DIAGNOSIS — Z1212 Encounter for screening for malignant neoplasm of rectum: Secondary | ICD-10-CM | POA: Diagnosis not present

## 2017-08-05 DIAGNOSIS — Z1211 Encounter for screening for malignant neoplasm of colon: Secondary | ICD-10-CM | POA: Diagnosis not present

## 2017-08-05 DIAGNOSIS — Z1231 Encounter for screening mammogram for malignant neoplasm of breast: Secondary | ICD-10-CM

## 2017-08-05 DIAGNOSIS — Z01419 Encounter for gynecological examination (general) (routine) without abnormal findings: Secondary | ICD-10-CM | POA: Insufficient documentation

## 2017-08-05 LAB — HEMOCCULT GUIAC POC 1CARD (OFFICE): Fecal Occult Blood, POC: NEGATIVE

## 2017-08-05 NOTE — Progress Notes (Signed)
Patient ID: Melinda Chapman, female   DOB: November 24, 1965, 51 y.o.   MRN: [id number] History of Present Illness: Melinda Chapman is a 51 year old white female, in for well woman gyn exam and pap, last pap 2013.  PCP is [facility].    Current Medications, Allergies, Past Medical History, Past Surgical History, Family History and Social History were reviewed in [employer] record.     Review of Systems:  Patient denies any headaches, hearing loss, fatigue, blurred vision, shortness of breath, chest pain, abdominal pain, problems with bowel movements, urination, or intercourse(not having sex). No joint pain or mood swings. Occasional hot flash.  Physical Exam:BP 140/80 (BP Location: Right Arm, Patient Position: Sitting, Cuff Size: Normal)   Pulse 72   Ht 5' 0.25" (1.53 m)   Wt 120 lb (54.4 kg)   BMI 23.24 kg/m  General:  Well developed, well nourished, no acute distress Skin:  Warm and dry Neck:  Midline trachea, normal thyroid, good ROM, no lymphadenopathy Lungs; Clear to auscultation bilaterally Breast:  No dominant palpable mass, retraction, or nipple discharge Cardiovascular: Regular rate and rhythm Abdomen:  Soft, non tender, no hepatosplenomegaly Pelvic:  External genitalia is normal in appearance, no lesions.  The vagina is pale with loss of rugae.Marland Kitchen Urethra has no lesions or masses. The cervix is smooth, pap with HPV performed.  Uterus is felt to be normal size, shape, and contour.  No adnexal masses or tenderness noted.Bladder is non tender, no masses felt. Rectal: Good sphincter tone, no polyps, or hemorrhoids felt.  Hemoccult negative. Extremities/musculoskeletal:  No swelling or varicosities noted, no clubbing or cyanosis Psych:  No mood changes, alert and cooperative,seems happy PHQ 2 score 0.  Impression: 1. Encounter for gynecological examination with Papanicolaou smear of cervix   2. Screening for colorectal cancer       Plan: Physical in 1 year Pap in 3  if normal Mammogram yearly Colonoscopy per GI Labs with PCP

## 2017-08-11 LAB — CYTOLOGY - PAP
Adequacy: ABSENT
Diagnosis: NEGATIVE
HPV: DETECTED — AB

## 2017-08-12 ENCOUNTER — Telehealth: Payer: Self-pay | Admitting: Adult Health

## 2017-08-12 ENCOUNTER — Encounter: Payer: Self-pay | Admitting: Adult Health

## 2017-08-12 DIAGNOSIS — R87810 Cervical high risk human papillomavirus (HPV) DNA test positive: Secondary | ICD-10-CM

## 2017-08-12 HISTORY — DX: Cervical high risk human papillomavirus (HPV) DNA test positive: R87.810

## 2017-08-12 NOTE — Telephone Encounter (Signed)
Pt aware of pap and +HPV repeat in 1 year

## 2017-08-22 NOTE — Procedures (Signed)
   Denning A. Merlene Laughter, MD     [email or web address]             HOME SLEEP STUDY  LOCATION: [facility]  Patient Name: Melinda Chapman, Melinda Chapman Date: 08/03/2017 Gender: Female D.O.B: [birth year] Age (years): 63 Referring Provider: Viona Gilmore. Rosemary Holms Height (inches): 60 Interpreting Physician: Phillips Odor MD, ABSM Weight (lbs): 121 RPSGT: Peak, Robert BMI: 24 MRN: [id number] Neck Size: CLINICAL INFORMATION Sleep Study Type: HST     Indication for sleep study: Fatigue, OSA, Snoring, Witnessed Apneas     Epworth Sleepiness Score: NA  SLEEP STUDY TECHNIQUE A multi-channel overnight portable sleep study was performed. The channels recorded were: nasal airflow, thoracic respiratory movement, and oxygen saturation with a pulse oximetry. Snoring was also monitored.  MEDICATIONS Patient self administered medications include: N/A.  Current Outpatient Medications:  .  esomeprazole (NEXIUM) 40 MG capsule, 1 qd for GERD (Patient taking differently: 1 prn for GERD), Disp: 30 capsule, Rfl: 5 .  lisinopril (PRINIVIL,ZESTRIL) 20 MG tablet, Take 1 tablet (20 mg total) by mouth daily., Disp: 30 tablet, Rfl: 12 .  LORazepam (ATIVAN) 0.5 MG tablet, Take 1 tablet (0.5 mg total) by mouth 2 (two) times daily as needed for anxiety., Disp: 30 tablet, Rfl: 2 .  sertraline (ZOLOFT) 25 MG tablet, Take 1 tablet (25 mg total) by mouth daily. (Patient taking differently: Take 12.5 mg by mouth daily. ), Disp: 30 tablet, Rfl: 5   SLEEP ARCHITECTURE Patient was studied for 483.0 minutes. The sleep efficiency was 97.7 % and the patient was supine for 24.1%. The arousal index was 0.0 per hour.  RESPIRATORY PARAMETERS The overall AHI was 8.4 per hour, with a central apnea index of 0.4 per hour.  The oxygen nadir was 84% during sleep.     CARDIAC DATA Mean heart rate during sleep was 56.6 bpm.  IMPRESSIONS Mild obstructive sleep apnea occurred during this study (AHI = 8.4/h.   Positive pressures treatment is not required.   Delano Metz, MD Diplomate, American Board of Sleep Medicine.  ELECTRONICALLY SIGNED ON:  08/22/2017, 8:44 PM [place] PH: [phone]   FX: [phone] [place]

## 2017-08-25 ENCOUNTER — Encounter: Payer: Self-pay | Admitting: Family Medicine

## 2017-08-25 DIAGNOSIS — G4730 Sleep apnea, unspecified: Secondary | ICD-10-CM | POA: Insufficient documentation

## 2017-08-25 NOTE — Progress Notes (Signed)
Her sleep study shows mild sleep apnea but by criteria expert states that this does not need CPAP because it is not severe enough- if the patient is having severe daytime sleepiness I recommend a follow-up office visit to discuss further

## 2017-08-25 NOTE — Telephone Encounter (Signed)
Nurses-please call the patient with the results of her sleep study see telephone message/result message-patient may need to do a follow-up office visit within the next several weeks if ongoing sleepiness and fatigue

## 2017-09-13 ENCOUNTER — Ambulatory Visit (HOSPITAL_COMMUNITY)
Admission: RE | Admit: 2017-09-13 | Discharge: 2017-09-13 | Disposition: A | Discharge status: Home / Self Care | Payer: BLUE CROSS/BLUE SHIELD | Source: Ambulatory Visit | Attending: Family Medicine | Admitting: Family Medicine

## 2017-09-13 ENCOUNTER — Telehealth: Payer: Self-pay | Admitting: Family Medicine

## 2017-09-13 ENCOUNTER — Encounter: Payer: Self-pay | Admitting: Family Medicine

## 2017-09-13 ENCOUNTER — Ambulatory Visit: Payer: BLUE CROSS/BLUE SHIELD | Admitting: Family Medicine

## 2017-09-13 VITALS — BP 138/86 | Ht 60.25 in | Wt 120.0 lb

## 2017-09-13 DIAGNOSIS — R55 Syncope and collapse: Secondary | ICD-10-CM | POA: Diagnosis not present

## 2017-09-13 DIAGNOSIS — R002 Palpitations: Secondary | ICD-10-CM | POA: Diagnosis not present

## 2017-09-13 NOTE — Telephone Encounter (Signed)
Patient scheduled office visit for today with Dr Nicki Reaper

## 2017-09-13 NOTE — Telephone Encounter (Signed)
Please see my chart message, patient needs office visit preferably today if still availability otherwise needs office visit on Tuesday please read my chart message

## 2017-09-13 NOTE — Telephone Encounter (Signed)
Needs office visit.  Today if possible.  If scheduled totally full then that needs office visit on Tuesday if patient feels she is having severe issues she should go to ER

## 2017-09-13 NOTE — Patient Instructions (Signed)
Steps to Quit Smoking Smoking tobacco can be bad for your health. It can also affect almost every organ in your body. Smoking puts you and people around you at risk for many serious long-lasting (chronic) diseases. Quitting smoking is hard, but it is one of the best things that you can do for your health. It is never too late to quit. What are the benefits of quitting smoking? When you quit smoking, you lower your risk for getting serious diseases and conditions. They can include:  Lung cancer or lung disease.  Heart disease.  Stroke.  Heart attack.  Not being able to have children (infertility).  Weak bones (osteoporosis) and broken bones (fractures).  If you have coughing, wheezing, and shortness of breath, those symptoms may get better when you quit. You may also get sick less often. If you are pregnant, quitting smoking can help to lower your chances of having a baby of low birth weight. What can I do to help me quit smoking? Talk with your doctor about what can help you quit smoking. Some things you can do (strategies) include:  Quitting smoking totally, instead of slowly cutting back how much you smoke over a period of time.  Going to in-person counseling. You are more likely to quit if you go to many counseling sessions.  Using resources and support systems, such as: ? Online chats with a counselor. ? Phone quitlines. ? Printed self-help materials. ? Support groups or group counseling. ? Text messaging programs. ? Mobile phone apps or applications.  Taking medicines. Some of these medicines may have nicotine in them. If you are pregnant or breastfeeding, do not take any medicines to quit smoking unless your doctor says it is okay. Talk with your doctor about counseling or other things that can help you.  Talk with your doctor about using more than one strategy at the same time, such as taking medicines while you are also going to in-person counseling. This can help make  quitting easier. What things can I do to make it easier to quit? Quitting smoking might feel very hard at first, but there is a lot that you can do to make it easier. Take these steps:  Talk to your family and friends. Ask them to support and encourage you.  Call phone quitlines, reach out to support groups, or work with a counselor.  Ask people who smoke to not smoke around you.  Avoid places that make you want (trigger) to smoke, such as: ? Bars. ? Parties. ? Smoke-break areas at work.  Spend time with people who do not smoke.  Lower the stress in your life. Stress can make you want to smoke. Try these things to help your stress: ? Getting regular exercise. ? Deep-breathing exercises. ? Yoga. ? Meditating. ? Doing a body scan. To do this, close your eyes, focus on one area of your body at a time from head to toe, and notice which parts of your body are tense. Try to relax the muscles in those areas.  Download or buy apps on your mobile phone or tablet that can help you stick to your quit plan. There are many free apps, such as QuitGuide from the CDC (Centers for Disease Control and Prevention). You can find more support from smokefree.gov and other websites.  This information is not intended to replace advice given to you by your health care provider. Make sure you discuss any questions you have with your health care provider. Document Released: 05/30/2009 Document   Revised: 03/31/2016 Document Reviewed: 12/18/2014 Elsevier Interactive Patient Education  2018 Elsevier Inc.  

## 2017-09-13 NOTE — Progress Notes (Signed)
   Subjective:    Patient ID: Melinda Chapman, female    DOB: 1966/04/21, 52 y.o.   MRN: [id number]  HPIpt passed out walking back to her bedroom after using bathroom last night.  Hit back of head and left arm.  Patient states she got up to use the bathroom as she was sitting on the commode she felt her hands getting warm and hot and then she felt slightly odd she stood up after using the bathroom a few steps later she passed out she states she woke up on the bathroom floor she does not know how long she was out she denied any severe headaches shortness of breath denied any chest tightness pressure pain she does relate some head congestion past few days but no fever chills or sweats no vomiting or diarrhea  Heart fluttering. Off and on for awhile. Worse this weekend.  She will feel her heart being irregular and other times it will run fast for 30 seconds to a minute she denies any chest pressure tightness or pain with it denies any shortness of breath.  She does not think this caused her to pass out she never had this problem before does not know of anything that triggers it or makes it better and she relates no family history she does smoke she knows she needs to quit  She states she had some blood in her saliva the other day she thinks it may have come from as a postnasal drip or from her gums she does not know of any injury she denies coughing she denies hemoptysis issues    Review of Systems  Constitutional: Negative for activity change, diaphoresis, fatigue and fever.  HENT: Negative for congestion.   Respiratory: Negative for cough, choking, chest tightness and shortness of breath.   Cardiovascular: Positive for palpitations. Negative for chest pain and leg swelling.  Gastrointestinal: Negative for abdominal pain, diarrhea, nausea and vomiting.  Skin: Negative for color change.  Neurological: Negative for headaches.  Psychiatric/Behavioral: Negative for behavioral problems.         Objective:   Physical Exam  Constitutional: She appears well-developed and well-nourished. No distress.  HENT:  Head: Normocephalic and atraumatic.  Eyes: Right eye exhibits no discharge. Left eye exhibits no discharge.  Neck: No tracheal deviation present.  Cardiovascular: Normal rate, regular rhythm and normal heart sounds.  No murmur heard. Pulmonary/Chest: Effort normal and breath sounds normal. No respiratory distress. She has no wheezes. She has no rales.  Musculoskeletal: She exhibits no edema.  Lymphadenopathy:    She has no cervical adenopathy.  Neurological: She is alert. She exhibits normal muscle tone.  Skin: Skin is warm and dry. No erythema.  Psychiatric: Her behavior is normal.  Vitals reviewed. Orthostatics were negative   25 minutes was spent with the patient. Greater than half the time was spent in discussion and answering questions and counseling regarding the issues that the patient came in for today.      Assessment & Plan:  Syncope-post micturition Palpitations with heart flutter-referral to cardiology for further evaluation probably telemetry  Blood in oral secretions no sign of any bleeding in her mouth but her gums do have some mild disease no cancer seen patient was told that this happens again we will set her up with ENT Chest x-ray ordered because of palpitations lab work ordered as well  Patient advised to quit smoking

## 2017-09-13 NOTE — Telephone Encounter (Signed)
Patient scheduled office visit today with Dr Scott 

## 2017-09-14 ENCOUNTER — Encounter: Payer: Self-pay | Admitting: Family Medicine

## 2017-09-15 ENCOUNTER — Encounter: Payer: Self-pay | Admitting: Family Medicine

## 2017-09-25 DIAGNOSIS — R55 Syncope and collapse: Secondary | ICD-10-CM | POA: Diagnosis not present

## 2017-09-25 DIAGNOSIS — R002 Palpitations: Secondary | ICD-10-CM | POA: Diagnosis not present

## 2017-09-26 LAB — BASIC METABOLIC PANEL
BUN / CREAT RATIO: 11 (ref 9–23)
BUN: 7 mg/dL (ref 6–24)
CALCIUM: 9.8 mg/dL (ref 8.7–10.2)
CHLORIDE: 100 mmol/L (ref 96–106)
CO2: 24 mmol/L (ref 20–29)
Creatinine, Ser: 0.63 mg/dL (ref 0.57–1.00)
GFR calc non Af Amer: 103 mL/min/{1.73_m2} (ref >59)
GFR, EST AFRICAN AMERICAN: 118 mL/min/{1.73_m2} (ref >59)
GLUCOSE: 100 mg/dL — AB (ref 65–99)
POTASSIUM: 4.6 mmol/L (ref 3.5–5.2)
Sodium: 140 mmol/L (ref 134–144)

## 2017-09-26 LAB — TSH: TSH: 2.58 u[IU]/mL (ref 0.450–4.500)

## 2017-09-26 LAB — T4, FREE: Free T4: 0.96 ng/dL (ref 0.82–1.77)

## 2017-10-05 ENCOUNTER — Encounter: Payer: Self-pay | Admitting: Cardiology

## 2017-10-05 ENCOUNTER — Ambulatory Visit: Payer: BLUE CROSS/BLUE SHIELD | Admitting: Cardiology

## 2017-10-05 VITALS — BP 138/80 | HR 74 | Ht 60.0 in | Wt 119.8 lb

## 2017-10-05 DIAGNOSIS — R002 Palpitations: Secondary | ICD-10-CM | POA: Diagnosis not present

## 2017-10-05 DIAGNOSIS — R55 Syncope and collapse: Secondary | ICD-10-CM | POA: Diagnosis not present

## 2017-10-05 NOTE — Patient Instructions (Signed)
Your physician recommends that you schedule a follow-up appointment in: 6 WEEKS WITH DR BRANCH  Your physician recommends that you continue on your current medications as directed. Please refer to the Current Medication list given to you today.  Your physician has recommended that you wear an event monitor FOR 2 WEEKS. Event monitors are medical devices that record the heart's electrical activity. Doctors most often us these monitors to diagnose arrhythmias. Arrhythmias are problems with the speed or rhythm of the heartbeat. The monitor is a small, portable device. You can wear one while you do your normal daily activities. This is usually used to diagnose what is causing palpitations/syncope (passing out).  Thank you for choosing [facility]!!      

## 2017-10-05 NOTE — Progress Notes (Signed)
Clinical Summary Melinda Chapman is a 52 y.o.female seen as new consult, referred by Dr Wolfgang Phoenix for palpitations  1. Palpitations/Syncope - episode of syncope Jan 2019  - isolated episode. Occurred while in bathroom. Just urinated, stood up and felt severely dizzy. Felt her self passing out.  - palpitations all that weekend.  - chronic diarrehea unchanged. No N/V. - very little water, sweet tea x 2 glasses, regular Dr peppers a few a day, occasional coffee, no juices, no energy/drinks, occasional beer.  - can have some occasional orthostatic symptoms.   2. Fluttering - off and on for a year. She previously attributed anxiety - feeling of racing heart/fluttering. Can last few seconds, or up to a few minutes. -no specific triggers - variable frequency.   - highest level of activity is housework. Can have some SOB with activities. Some LE edema that resolved - TSH 2.58, K 4.6 Past Medical History:  Diagnosis Date  . Anxiety   . Constipation   . Diabetes in pregnancy   . Diarrhea   . GERD (gastroesophageal reflux disease)   . Hypertension   . IBS (irritable bowel syndrome)   . Papanicolaou smear of cervix with positive high risk human papilloma virus (HPV) test 08/12/2017     Allergies  Allergen Reactions  . Zoloft [Sertraline Hcl]     diarrhea     Current Outpatient Medications  Medication Sig Dispense Refill  . esomeprazole (NEXIUM) 40 MG capsule 1 qd for GERD (Patient taking differently: 1 prn for GERD) 30 capsule 5  . lisinopril (PRINIVIL,ZESTRIL) 20 MG tablet Take 1 tablet (20 mg total) by mouth daily. 30 tablet 12  . LORazepam (ATIVAN) 0.5 MG tablet Take 1 tablet (0.5 mg total) by mouth 2 (two) times daily as needed for anxiety. 30 tablet 2  . sertraline (ZOLOFT) 25 MG tablet Take 1 tablet (25 mg total) by mouth daily. (Patient taking differently: Take 12.5 mg by mouth daily. ) 30 tablet 5   No current facility-administered medications for this visit.      Past  Surgical History:  Procedure Laterality Date  . APPENDECTOMY  1983  . [place], 2002   x 2  . CHOLECYSTECTOMY  2000   in [place]  . ENDOMETRIAL ABLATION  2011  . TUBAL LIGATION  2002     Allergies  Allergen Reactions  . Zoloft [Sertraline Hcl]     diarrhea      Family History  Problem Relation Age of Onset  . Colon cancer Paternal Grandmother   . Hypertension Mother   . Hyperlipidemia Mother   . Colon polyps Father   . Colon polyps Brother   . Colon polyps Paternal Aunt   . Esophageal cancer Neg Hx   . Stomach cancer Neg Hx      Social History Melinda Chapman reports that she has been smoking cigarettes.  She has a 15.00 pack-year smoking history. she has never used smokeless tobacco. Melinda Chapman reports that she drinks about 3.0 oz of alcohol per week.   Review of Systems CONSTITUTIONAL: No weight loss, fever, chills, weakness or fatigue.  HEENT: Eyes: No visual loss, blurred vision, double vision or yellow sclerae.No hearing loss, sneezing, congestion, runny nose or sore throat.  SKIN: No rash or itching.  CARDIOVASCULAR: per hpi RESPIRATORY: No shortness of breath, cough or sputum.  GASTROINTESTINAL: No anorexia, nausea, vomiting or diarrhea. No abdominal pain or blood.  GENITOURINARY: No burning on urination, no polyuria NEUROLOGICAL: per hpi  MUSCULOSKELETAL: No muscle, back pain, joint pain or stiffness.  LYMPHATICS: No enlarged nodes. No history of splenectomy.  PSYCHIATRIC: No history of depression or anxiety.  ENDOCRINOLOGIC: No reports of sweating, cold or heat intolerance. No polyuria or polydipsia.  Marland Kitchen   Physical Examination Vitals:   10/05/17 0916  BP: 138/80  Pulse: 74  SpO2: 98%   Vitals:   10/05/17 0916  Weight: 119 lb 12.8 oz (54.3 kg)  Height: 5' (1.524 m)    Gen: resting comfortably, no acute distress HEENT: no scleral icterus, pupils equal round and reactive, no palptable cervical adenopathy,  CV: RRR, no m/r/g no  jvd Resp: Clear to auscultation bilaterally GI: abdomen is soft, non-tender, non-distended, normal bowel sounds, no hepatosplenomegaly MSK: extremities are warm, no edema.  Skin: warm, no rash Neuro:  no focal deficits Psych: appropriate affect     Assessment and Plan  1. Syncope - isolated episode, probable orthostatic syncope based on history. She is orthostatic in clinic DBP drops 10 points with standing, overall poor oral hydration - encouraged increased hydration - given her palpitations will also obtain a 2 week event monitor  2. Palpitations - EKG in clinic today shows normal sinus rhythm - wean caffeine - we will obtain 2 week event monitor    F/u 4-6 weeks  Arnoldo Lenis, M.D.

## 2017-10-06 ENCOUNTER — Encounter: Payer: Self-pay | Admitting: Cardiology

## 2017-10-06 DIAGNOSIS — R002 Palpitations: Secondary | ICD-10-CM | POA: Diagnosis not present

## 2017-10-08 DIAGNOSIS — R002 Palpitations: Secondary | ICD-10-CM | POA: Diagnosis not present

## 2017-11-22 ENCOUNTER — Encounter: Payer: Self-pay | Admitting: Cardiology

## 2017-11-22 ENCOUNTER — Ambulatory Visit: Payer: BLUE CROSS/BLUE SHIELD | Admitting: Cardiology

## 2017-11-22 VITALS — BP 123/72 | HR 75 | Ht 60.0 in | Wt 123.2 lb

## 2017-11-22 DIAGNOSIS — R002 Palpitations: Secondary | ICD-10-CM

## 2017-11-22 DIAGNOSIS — R55 Syncope and collapse: Secondary | ICD-10-CM

## 2017-11-22 NOTE — Progress Notes (Signed)
Clinical Summary Melinda Chapman is a 52 y.o.female seen today for follow up of the following medical problems.   1.Syncope - episode of syncope Jan 2019 - isolated episode. Occurred while in bathroom. Just urinated, stood up and felt severely dizzy. Felt her self passing out.  - palpitations all that weekend.  - chronic diarrehea unchanged. No N/V. - very little water, sweet tea x 2 glasses, regular Dr peppers a few a day, occasional coffee, no juices, no energy/drinks, occasional beer.  - can have some occasional orthostatic symptoms.  - no recurrent syncope since last visit.   2.Palpitatons.  - off and on for a year. She previously attributed anxiety - feeling of racing heart/fluttering. Can last few seconds, or up to a few minutes. -no specific triggers - variable frequency.   - highest level of activity is housework. Can have some SOB with activities. Some LE edema that resolved - TSH 2.58, K 4.6 - 09/2017 monitor with SR, rare PACs and PVCs. No significant arrhythmias - still with symptoms at times    Past Medical History:  Diagnosis Date  . Anxiety   . Constipation   . Diabetes in pregnancy   . Diarrhea   . GERD (gastroesophageal reflux disease)   . Hypertension   . IBS (irritable bowel syndrome)   . Papanicolaou smear of cervix with positive high risk human papilloma virus (HPV) test 08/12/2017     Allergies  Allergen Reactions  . Zoloft [Sertraline Hcl]     diarrhea     Current Outpatient Medications  Medication Sig Dispense Refill  . esomeprazole (NEXIUM) 40 MG capsule Take 40 mg by mouth daily at 12 noon.    Marland Kitchen lisinopril (PRINIVIL,ZESTRIL) 20 MG tablet Take 1 tablet (20 mg total) by mouth daily. 30 tablet 12  . LORazepam (ATIVAN) 0.5 MG tablet Take 0.5 mg by mouth as needed for anxiety (very rarely uses).    . sertraline (ZOLOFT) 25 MG tablet Take 12.5 mg by mouth daily.     No current facility-administered medications for this visit.       Past Surgical History:  Procedure Laterality Date  . APPENDECTOMY  1983  . [place], 2002   x 2  . CHOLECYSTECTOMY  2000   in [place]  . ENDOMETRIAL ABLATION  2011  . TUBAL LIGATION  2002     Allergies  Allergen Reactions  . Zoloft [Sertraline Hcl]     diarrhea      Family History  Problem Relation Age of Onset  . Colon cancer Paternal Grandmother   . Hypertension Mother   . Hyperlipidemia Mother   . Colon polyps Father   . Colon polyps Brother   . Colon polyps Paternal Aunt   . Esophageal cancer Neg Hx   . Stomach cancer Neg Hx      Social History Melinda Chapman reports that she has been smoking cigarettes.  She has a 15.00 pack-year smoking history. She has never used smokeless tobacco. Melinda Chapman reports that she drinks about 3.0 oz of alcohol per week.   Review of Systems CONSTITUTIONAL: No weight loss, fever, chills, weakness or fatigue.  HEENT: Eyes: No visual loss, blurred vision, double vision or yellow sclerae.No hearing loss, sneezing, congestion, runny nose or sore throat.  SKIN: No rash or itching.  CARDIOVASCULAR: per hpi RESPIRATORY: No shortness of breath, cough or sputum.  GASTROINTESTINAL: No anorexia, nausea, vomiting or diarrhea. No abdominal pain or blood.  GENITOURINARY: No burning on  urination, no polyuria NEUROLOGICAL: No headache, dizziness, syncope, paralysis, ataxia, numbness or tingling in the extremities. No change in bowel or bladder control.  MUSCULOSKELETAL: No muscle, back pain, joint pain or stiffness.  LYMPHATICS: No enlarged nodes. No history of splenectomy.  PSYCHIATRIC: No history of depression or anxiety.  ENDOCRINOLOGIC: No reports of sweating, cold or heat intolerance. No polyuria or polydipsia.  Marland Kitchen   Physical Examination Vitals:   11/22/17 1432  BP: 123/72  Pulse: 75  SpO2: 98%   Vitals:   11/22/17 1432  Weight: 123 lb 3.2 oz (55.9 kg)  Height: 5' (1.524 m)    Gen: resting comfortably, no  acute distress HEENT: no scleral icterus, pupils equal round and reactive, no palptable cervical adenopathy,  CV: RRR, no m/r/g ,no jvd Resp: Clear to auscultation bilaterally GI: abdomen is soft, non-tender, non-distended, normal bowel sounds, no hepatosplenomegaly MSK: extremities are warm, no edema.  Skin: warm, no rash Neuro:  no focal deficits Psych: appropriate affect   Diagnostic Studies     Assessment and Plan  1. Syncope - isolated episode, probable orthostatic syncope based on history. She was prevoiusly  orthostatic in clinic DBP drops 10 points with standing, overall poor oral hydration -no recurrent episodes, Benign cardiac monitor - continue aggresssive hydration.   2. Palpitations -overall benign monitor, occasoinal PVCs and PACs - symptoms tolerable, she is not interested in medical therapy for symptoms at this time.     F/u 1 year       Arnoldo Lenis, M.D.

## 2017-11-22 NOTE — Patient Instructions (Signed)

## 2017-11-26 ENCOUNTER — Encounter: Payer: Self-pay | Admitting: Cardiology

## 2018-01-19 ENCOUNTER — Other Ambulatory Visit: Payer: Self-pay | Admitting: Family Medicine

## 2018-01-21 NOTE — Telephone Encounter (Signed)
Pt states dr Nicki Reaper has been prescribing this med and she takes 25mg  daily. Refills sent to pharm

## 2018-01-21 NOTE — Telephone Encounter (Signed)
I would verify the dose with the patient.  Also figure out from what the patient states do they want [place] to renew this or is another provider prescribing this?  Difficult to tell based on this information thank you- once you verify the dose if the patient wants [place] to prescribe it it would be fine to do 6 months

## 2018-01-26 ENCOUNTER — Ambulatory Visit: Payer: BLUE CROSS/BLUE SHIELD | Admitting: Family Medicine

## 2018-01-26 ENCOUNTER — Encounter: Payer: Self-pay | Admitting: Family Medicine

## 2018-01-26 VITALS — BP 140/80 | HR 72 | Temp 98.4°F | Ht 60.0 in | Wt 122.8 lb

## 2018-01-26 DIAGNOSIS — J310 Chronic rhinitis: Secondary | ICD-10-CM

## 2018-01-26 DIAGNOSIS — J329 Chronic sinusitis, unspecified: Secondary | ICD-10-CM

## 2018-01-26 MED ORDER — AZITHROMYCIN 250 MG PO TABS
ORAL_TABLET | ORAL | 0 refills | Status: DC
Start: 2018-01-26 — End: 2018-06-17

## 2018-01-26 NOTE — Progress Notes (Signed)
   Subjective:    Patient ID: Melinda Chapman, female    DOB: [birth year], 52 y.o.   MRN: [id number]  Cough  This is a new problem. The current episode started 1 to 4 weeks ago (2 weeks). The problem has been unchanged. The cough is productive of sputum. Associated symptoms include headaches and a sore throat. Associated symptoms comments: Runny nose. She has tried OTC cough suppressant for the symptoms. The treatment provided mild relief.     Two weeks worth f symtoms  tickly cough coning and going  Son was sick around two seek s ago   neds to talk with job   Pos chest congestion  Do  Smoke   Two weeks worth of syntos  Some productive phlegm gunky    No fever or chills    Review of Systems  HENT: Positive for sore throat.   Respiratory: Positive for cough.   Neurological: Positive for headaches.       Objective:   Physical Exam  Alert, mild malaise. Hydration good Vitals stable. frontal/ maxillary tenderness evident positive nasal congestion. pharynx normal neck supple  lungs clear/no crackles or wheezes. heart regular in rhythm       Assessment & Plan:  Impression rhinosinusitis/with element of bronchitis also.  Encouraged to stop smoking likely post viral, discussed with patient. plan antibiotics prescribed. Questions answered. Symptomatic care discussed. warning signs discussed. WSL

## 2018-06-16 ENCOUNTER — Encounter: Payer: Self-pay | Admitting: Family Medicine

## 2018-06-16 NOTE — Telephone Encounter (Signed)
Called pt because zoloft was on med list but did not have a provider name beside of rx. It was under history but last sent in 01/26/18 for  A 30 day supply. Pt states she stopped taking it for awhile but started back taking it and needs refill. She takes 25mg  one daily. Last seen for depression 07/15/17 and does not have an upcoming appt

## 2018-06-17 MED ORDER — SERTRALINE HCL 25 MG PO TABS: 25.0000 mg | ORAL_TABLET | Freq: Every day | ORAL | 1 refills | Status: DC

## 2018-06-17 NOTE — Addendum Note (Signed)
Addended by: Dairl Ponder on: 06/17/2018 04:46 PM   Modules accepted: Orders

## 2018-06-17 NOTE — Telephone Encounter (Signed)
Patient may have 30-day supply with 1 refill needs follow-up office visit

## 2018-07-15 ENCOUNTER — Encounter: Payer: BLUE CROSS/BLUE SHIELD | Admitting: Family Medicine

## 2018-07-18 ENCOUNTER — Other Ambulatory Visit: Payer: Self-pay | Admitting: Family Medicine

## 2018-07-29 ENCOUNTER — Ambulatory Visit (INDEPENDENT_AMBULATORY_CARE_PROVIDER_SITE_OTHER): Payer: BLUE CROSS/BLUE SHIELD | Admitting: Family Medicine

## 2018-07-29 ENCOUNTER — Encounter: Payer: Self-pay | Admitting: Family Medicine

## 2018-07-29 VITALS — BP 134/68 | Ht 60.25 in | Wt 124.0 lb

## 2018-07-29 DIAGNOSIS — Z0000 Encounter for general adult medical examination without abnormal findings: Secondary | ICD-10-CM

## 2018-07-29 DIAGNOSIS — E559 Vitamin D deficiency, unspecified: Secondary | ICD-10-CM

## 2018-07-29 DIAGNOSIS — Z13220 Encounter for screening for lipoid disorders: Secondary | ICD-10-CM

## 2018-07-29 DIAGNOSIS — I10 Essential (primary) hypertension: Secondary | ICD-10-CM

## 2018-07-29 MED ORDER — LORAZEPAM 0.5 MG PO TABS: ORAL_TABLET | ORAL | 0 refills | Status: DC

## 2018-07-29 MED ORDER — LISINOPRIL 20 MG PO TABS: 20.0000 mg | ORAL_TABLET | Freq: Every day | ORAL | 12 refills | Status: DC

## 2018-07-29 MED ORDER — SERTRALINE HCL 25 MG PO TABS: ORAL_TABLET | ORAL | 12 refills | Status: DC

## 2018-07-29 NOTE — Patient Instructions (Signed)
Steps to Quit Smoking Smoking tobacco can be bad for your health. It can also affect almost every organ in your body. Smoking puts you and people around you at risk for many serious long-lasting (chronic) diseases. Quitting smoking is hard, but it is one of the best things that you can do for your health. It is never too late to quit. What are the benefits of quitting smoking? When you quit smoking, you lower your risk for getting serious diseases and conditions. They can include:  Lung cancer or lung disease.  Heart disease.  Stroke.  Heart attack.  Not being able to have children (infertility).  Weak bones (osteoporosis) and broken bones (fractures).  If you have coughing, wheezing, and shortness of breath, those symptoms may get better when you quit. You may also get sick less often. If you are pregnant, quitting smoking can help to lower your chances of having a baby of low birth weight. What can I do to help me quit smoking? Talk with your doctor about what can help you quit smoking. Some things you can do (strategies) include:  Quitting smoking totally, instead of slowly cutting back how much you smoke over a period of time.  Going to in-person counseling. You are more likely to quit if you go to many counseling sessions.  Using resources and support systems, such as: ? Online chats with a counselor. ? Phone quitlines. ? Printed self-help materials. ? Support groups or group counseling. ? Text messaging programs. ? Mobile phone apps or applications.  Taking medicines. Some of these medicines may have nicotine in them. If you are pregnant or breastfeeding, do not take any medicines to quit smoking unless your doctor says it is okay. Talk with your doctor about counseling or other things that can help you.  Talk with your doctor about using more than one strategy at the same time, such as taking medicines while you are also going to in-person counseling. This can help make  quitting easier. What things can I do to make it easier to quit? Quitting smoking might feel very hard at first, but there is a lot that you can do to make it easier. Take these steps:  Talk to your family and friends. Ask them to support and encourage you.  Call phone quitlines, reach out to support groups, or work with a counselor.  Ask people who smoke to not smoke around you.  Avoid places that make you want (trigger) to smoke, such as: ? Bars. ? Parties. ? Smoke-break areas at work.  Spend time with people who do not smoke.  Lower the stress in your life. Stress can make you want to smoke. Try these things to help your stress: ? Getting regular exercise. ? Deep-breathing exercises. ? Yoga. ? Meditating. ? Doing a body scan. To do this, close your eyes, focus on one area of your body at a time from head to toe, and notice which parts of your body are tense. Try to relax the muscles in those areas.  Download or buy apps on your mobile phone or tablet that can help you stick to your quit plan. There are many free apps, such as QuitGuide from the CDC (Centers for Disease Control and Prevention). You can find more support from smokefree.gov and other websites.  This information is not intended to replace advice given to you by your health care provider. Make sure you discuss any questions you have with your health care provider. Document Released: 05/30/2009 Document   Revised: 03/31/2016 Document Reviewed: 12/18/2014 Elsevier Interactive Patient Education  2018 Elsevier Inc.  

## 2018-07-29 NOTE — Progress Notes (Signed)
Subjective:    Patient ID: Melinda Chapman, female    DOB: 1966/08/08, 52 y.o.   MRN: [id number]  HPI  The patient comes in today for a wellness visit. She relates that she does try to do a reasonable good job of eating healthy she states she stays active with her job but does not do any specific exercise she does smoke she knows she needs to quit smoking she denies any major issues denies any rectal bleeding hematuria She is under a fair amount of stress but denies being depressed she does state that Zoloft a small dose helps her and she does use lorazepam intermittently she denies abusing the medicine  Patient for blood pressure check up.  The patient does have hypertension.  The patient is on medication.  Patient relates compliance with meds. Todays BP reviewed with the patient. Patient denies issues with medication. Patient relates reasonable diet. Patient tries to minimize salt. Patient aware of BP goals.  Moderate amount of stress related issues but denies being depressed states medication does help her she would like to continue the medication denies abusing the medicine   A review of their health history was completed.  A review of medications was also completed.  Any needed refills; Yes   Eating habits: Good  Falls/  MVA accidents in past few months: No  Regular exercise: Yes  Specialist pt sees on regular basis: No  Preventative health issues were discussed.   Additional concerns: None  Pt sees OBGYN will not need a female exam today.  Review of Systems  Constitutional: Negative for activity change, appetite change and fatigue.  HENT: Negative for congestion and rhinorrhea.   Eyes: Negative for discharge.  Respiratory: Negative for cough, chest tightness and wheezing.   Cardiovascular: Negative for chest pain.  Gastrointestinal: Negative for abdominal pain, blood in stool and vomiting.  Endocrine: Negative for polyphagia.  Genitourinary: Negative for difficulty  urinating and frequency.  Musculoskeletal: Negative for neck pain.  Skin: Negative for color change.  Allergic/Immunologic: Negative for environmental allergies and food allergies.  Neurological: Negative for weakness and headaches.  Psychiatric/Behavioral: Negative for agitation and behavioral problems.       Objective:   Physical Exam Constitutional:      Appearance: She is well-developed.  HENT:     Head: Normocephalic.     Right Ear: External ear normal.     Left Ear: External ear normal.  Eyes:     Pupils: Pupils are equal, round, and reactive to light.  Neck:     Musculoskeletal: Normal range of motion.     Thyroid: No thyromegaly.  Cardiovascular:     Rate and Rhythm: Normal rate and regular rhythm.     Heart sounds: Normal heart sounds. No murmur.  Pulmonary:     Effort: Pulmonary effort is normal. No respiratory distress.     Breath sounds: Normal breath sounds. No wheezing.  Abdominal:     General: Bowel sounds are normal. There is no distension.     Palpations: Abdomen is soft. There is no mass.     Tenderness: There is no abdominal tenderness.  Musculoskeletal: Normal range of motion.        General: No tenderness.  Lymphadenopathy:     Cervical: No cervical adenopathy.  Skin:    General: Skin is warm and dry.  Neurological:     Mental Status: She is alert and oriented to person, place, and time.     Motor: No abnormal muscle  tone.  Psychiatric:        Behavior: Behavior normal.           Assessment & Plan:  Adult wellness-complete.wellness physical was conducted today. Importance of diet and exercise were discussed in detail.  In addition to this a discussion regarding safety was also covered. We also reviewed over immunizations and gave recommendations regarding current immunization needed for age.  In addition to this additional areas were also touched on including: Preventative health exams needed: She gets her gynecologic exam via GYN that also  includes breast exam Colonoscopy up-to-date on colonoscopy next 08-2020 Patient was advised yearly wellness exam  Patient stress levels are overall tolerable.  She does state that the medicine and benefits are she takes half of Zoloft daily but has the ability to go up to 25 mg if significant stressors  She does use lorazepam rarely but denies abusing it only uses it without driving  Does take her Nexium on a regular basis for reflux related issues denies any severe dysphagia issues is aware of her next colonoscopy

## 2018-07-30 ENCOUNTER — Other Ambulatory Visit: Payer: Self-pay | Admitting: Family Medicine

## 2018-07-30 LAB — LIPID PANEL
Chol/HDL Ratio: 3.6 ratio (ref 0.0–4.4)
Cholesterol, Total: 221 mg/dL — ABNORMAL HIGH (ref 100–199)
HDL: 62 mg/dL (ref >39)
LDL Calculated: 139 mg/dL — ABNORMAL HIGH (ref 0–99)
TRIGLYCERIDES: 98 mg/dL (ref 0–149)
VLDL Cholesterol Cal: 20 mg/dL (ref 5–40)

## 2018-07-30 LAB — BASIC METABOLIC PANEL
BUN/Creatinine Ratio: 12 (ref 9–23)
BUN: 7 mg/dL (ref 6–24)
CO2: 26 mmol/L (ref 20–29)
CREATININE: 0.57 mg/dL (ref 0.57–1.00)
Calcium: 9.8 mg/dL (ref 8.7–10.2)
Chloride: 97 mmol/L (ref 96–106)
GFR calc Af Amer: 122 mL/min/{1.73_m2} (ref >59)
GFR, EST NON AFRICAN AMERICAN: 105 mL/min/{1.73_m2} (ref >59)
Glucose: 100 mg/dL — ABNORMAL HIGH (ref 65–99)
Potassium: 4.6 mmol/L (ref 3.5–5.2)
Sodium: 138 mmol/L (ref 134–144)

## 2018-07-30 LAB — VITAMIN D 25 HYDROXY (VIT D DEFICIENCY, FRACTURES): VIT D 25 HYDROXY: 9.2 ng/mL — AB (ref 30.0–100.0)

## 2018-07-30 MED ORDER — VITAMIN D (ERGOCALCIFEROL) 1.25 MG (50000 UNIT) PO CAPS: ORAL_CAPSULE | ORAL | 0 refills | Status: DC

## 2018-09-01 ENCOUNTER — Other Ambulatory Visit: Payer: BLUE CROSS/BLUE SHIELD | Admitting: Adult Health

## 2018-09-13 ENCOUNTER — Other Ambulatory Visit: Payer: Self-pay | Admitting: Family Medicine

## 2018-09-19 ENCOUNTER — Encounter: Payer: Self-pay | Admitting: Family Medicine

## 2018-09-19 NOTE — Telephone Encounter (Signed)
Nurses Send patient-Melinda Chapman a message that we aware of the message she sent him will be handling the problem with her son  Contact the son.  For now with the patient has amlodipine 5 mg and 2.5 mg he can take 1 of each but I would like to do a follow-up blood pressure check on him next week.  It would be fine to give him a 4 PM appointment if this helps his schedule

## 2018-10-15 DIAGNOSIS — J111 Influenza due to unidentified influenza virus with other respiratory manifestations: Secondary | ICD-10-CM | POA: Diagnosis not present

## 2019-04-01 ENCOUNTER — Other Ambulatory Visit: Payer: Self-pay

## 2019-04-01 ENCOUNTER — Emergency Department (HOSPITAL_COMMUNITY)
Admission: EM | Admit: 2019-04-01 | Discharge: 2019-04-01 | Discharge status: Against Medical Advice | Payer: BLUE CROSS/BLUE SHIELD | Attending: Emergency Medicine | Admitting: Emergency Medicine

## 2019-04-01 ENCOUNTER — Encounter (HOSPITAL_COMMUNITY): Payer: Self-pay | Admitting: Emergency Medicine

## 2019-04-01 DIAGNOSIS — Z79899 Other long term (current) drug therapy: Secondary | ICD-10-CM | POA: Insufficient documentation

## 2019-04-01 DIAGNOSIS — Z5320 Procedure and treatment not carried out because of patient's decision for unspecified reasons: Secondary | ICD-10-CM | POA: Insufficient documentation

## 2019-04-01 DIAGNOSIS — K920 Hematemesis: Secondary | ICD-10-CM | POA: Insufficient documentation

## 2019-04-01 DIAGNOSIS — F17210 Nicotine dependence, cigarettes, uncomplicated: Secondary | ICD-10-CM | POA: Insufficient documentation

## 2019-04-01 DIAGNOSIS — I10 Essential (primary) hypertension: Secondary | ICD-10-CM | POA: Insufficient documentation

## 2019-04-01 NOTE — ED Provider Notes (Signed)
[facility] EMERGENCY DEPARTMENT Provider Note   CSN: [id number] Arrival date & time: 04/01/19  1641     History   Chief Complaint Chief Complaint  Patient presents with  . Emesis    HPI Melinda Chapman is a 53 y.o. female.     HPI   Melinda Chapman is a 53 y.o. female past medical history of GERD, hypertension, and IBS who presents to the Emergency Department complaining of sudden onset of diffuse abdominal pain and one extended episode of vomiting.  Symptoms began around 4 PM this evening.  She states that she developed abdominal pain initially and vomited a total of 3 times during the one episode.  She notes seeing bright red blood in her vomitus after the initial forceful episode of vomiting.  Since onset, her abdominal pain has resolved and she has noted no further vomiting or nausea.  She states that she has been under severe stress recently and believes this may have been a contributing factor.  She currently denies any symptoms and is requesting to be discharged.  She denies ASA or other NSAID use recently.  No chest pain, weakness or shortness of breath.  takes Nexium, but admits that she does not take it regularly.      Past Medical History:  Diagnosis Date  . Anxiety   . Constipation   . Diabetes in pregnancy   . Diarrhea   . GERD (gastroesophageal reflux disease)   . Hypertension   . IBS (irritable bowel syndrome)   . Papanicolaou smear of cervix with positive high risk human papilloma virus (HPV) test 08/12/2017    Patient Active Problem List   Diagnosis Date Noted  . Mild sleep apnea 08/25/2017  . Papanicolaou smear of cervix with positive high risk human papilloma virus (HPV) test 08/12/2017  . Encounter for gynecological examination with Papanicolaou smear of cervix 08/05/2017  . Essential hypertension, benign 03/14/2013  . Panic attack as reaction to stress 03/14/2013    Past Surgical History:  Procedure Laterality Date  . APPENDECTOMY  1983  .  [place], 2002   x 2  . CHOLECYSTECTOMY  2000   in [place]  . ENDOMETRIAL ABLATION  2011  . TUBAL LIGATION  2002     OB History    Gravida  2   Para  2   Term  2   Preterm      AB      Living  2     SAB      TAB      Ectopic      Multiple      Live Births  2            Home Medications    Prior to Admission medications   Medication Sig Start Date End Date Taking? Authorizing Provider  esomeprazole (NEXIUM) 40 MG capsule TAKE (1) CAPSULE BY MOUTH TWICE DAILY. 09/13/18   Kathyrn Drown, MD  lisinopril (PRINIVIL,ZESTRIL) 20 MG tablet Take 1 tablet (20 mg total) by mouth daily. 07/29/18   Kathyrn Drown, MD  LORazepam (ATIVAN) 0.5 MG tablet 1/2 to 1 bid prn 07/29/18   Kathyrn Drown, MD  sertraline (ZOLOFT) 25 MG tablet 1/2 or 1 as directed daily 07/29/18   Kathyrn Drown, MD  Vitamin D, Ergocalciferol, (DRISDOL) 1.25 MG (50000 UT) CAPS capsule 1 q week for 8 weeks 07/30/18   Kathyrn Drown, MD    Family History Family History  Problem  Relation Age of Onset  . Colon cancer Paternal Grandmother   . Hypertension Mother   . Hyperlipidemia Mother   . Colon polyps Father   . Colon polyps Brother   . Colon polyps Paternal Aunt   . Esophageal cancer Neg Hx   . Stomach cancer Neg Hx     Social History Social History   Tobacco Use  . Smoking status: Current Every Day Smoker    Packs/day: 0.50    Years: 30.00    Pack years: 15.00    Types: Cigarettes  . Smokeless tobacco: Never Used  Substance Use Topics  . Alcohol use: Yes    Alcohol/week: 5.0 standard drinks    Types: 5 Cans of beer per week    Comment: occasional  . Drug use: No     Allergies   Patient has no known allergies.   Review of Systems Review of Systems  Constitutional: Negative for appetite change, chills and fever.  Respiratory: Negative for shortness of breath.   Cardiovascular: Negative for chest pain.  Gastrointestinal: Positive for abdominal pain and  vomiting (vomiting blood). Negative for blood in stool, diarrhea and nausea.  Genitourinary: Negative for difficulty urinating and dysuria.  Skin: Negative for color change and rash.  Neurological: Negative for dizziness, syncope, weakness, numbness and headaches.  Hematological: Negative for adenopathy.     Physical Exam Updated Vital Signs BP (!) 171/87 (BP Location: Right Arm)   Pulse 76   Temp 98.1 F (36.7 C) (Oral)   Resp 19   Ht 5' (1.524 m)   Wt 54.4 kg   SpO2 100%   BMI 23.44 kg/m   Physical Exam Vitals signs and nursing note reviewed.  Constitutional:      General: She is not in acute distress.    Appearance: Normal appearance. She is not toxic-appearing.  HENT:     Mouth/Throat:     Mouth: Mucous membranes are moist.     Pharynx: Oropharynx is clear.  Eyes:     Conjunctiva/sclera: Conjunctivae normal.  Cardiovascular:     Rate and Rhythm: Normal rate and regular rhythm.     Pulses: Normal pulses.  Pulmonary:     Effort: Pulmonary effort is normal.     Breath sounds: Normal breath sounds.  Abdominal:     General: There is no distension.     Palpations: Abdomen is soft. There is no mass.     Tenderness: There is no abdominal tenderness. There is no guarding.  Musculoskeletal: Normal range of motion.     Right lower leg: No edema.     Left lower leg: No edema.  Skin:    General: Skin is warm.     Findings: No rash.  Neurological:     General: No focal deficit present.     Mental Status: She is alert and oriented to person, place, and time.     Sensory: Sensation is intact. No sensory deficit.     Motor: Motor function is intact. No weakness.     Comments: CN II-XII grossly intact.  Speech clear.       ED Treatments / Results  Labs (all labs ordered are listed, but only abnormal results are displayed) Labs Reviewed - No data to display  EKG None  Radiology No results found.  Procedures Procedures (including critical care time)   Medications Ordered in ED Medications - No data to display   Initial Impression / Assessment and Plan / ED Course  I have reviewed the  triage vital signs and the nursing notes.  Pertinent labs & imaging results that were available during my care of the patient were reviewed by me and considered in my medical decision making (see chart for details).      pt with complaint of abdominal pain and hematemesis. States that symptoms resolved prior to arrival and she is requesting to leave.  I have explained that situation could potentially be life threatening and recommended that she stay for evaluation.  She verbalized understanding of this, but continues to want to leave w/o work up.  I have explained that she may return at any time if she changes her mind or if she develops worsening sx's.  She agrees to sign out AMA    Final Clinical Impressions(s) / ED Diagnoses   Final diagnoses:  Hematemesis, presence of nausea not specified    ED Discharge Orders    None       Kem Parkinson, PA-C 04/02/19 Rupert Stacks, MD 04/04/19 1317

## 2019-04-01 NOTE — ED Triage Notes (Signed)
Pt c/o of abdominal pain and vomiting blood x 1 starting an hour ago.

## 2019-04-01 NOTE — Discharge Instructions (Addendum)
Return to the ER if you change your mind or develop worsening symptoms or continue to vomit blood.  I would recommend that you take your Nexium twice daily every day for at least 2 weeks

## 2019-08-03 ENCOUNTER — Ambulatory Visit (INDEPENDENT_AMBULATORY_CARE_PROVIDER_SITE_OTHER): Payer: Self-pay | Admitting: Family Medicine

## 2019-08-03 DIAGNOSIS — I10 Essential (primary) hypertension: Secondary | ICD-10-CM

## 2019-08-03 DIAGNOSIS — Z13220 Encounter for screening for lipoid disorders: Secondary | ICD-10-CM

## 2019-08-03 DIAGNOSIS — Z79899 Other long term (current) drug therapy: Secondary | ICD-10-CM

## 2019-08-03 DIAGNOSIS — E559 Vitamin D deficiency, unspecified: Secondary | ICD-10-CM

## 2019-08-03 MED ORDER — LORAZEPAM 0.5 MG PO TABS: ORAL_TABLET | ORAL | 1 refills | Status: DC

## 2019-08-03 MED ORDER — ESOMEPRAZOLE MAGNESIUM 40 MG PO CPDR: DELAYED_RELEASE_CAPSULE | ORAL | 5 refills | Status: DC

## 2019-08-03 MED ORDER — LISINOPRIL 20 MG PO TABS: 20.0000 mg | ORAL_TABLET | Freq: Every day | ORAL | 5 refills | Status: DC

## 2019-08-03 MED ORDER — SERTRALINE HCL 25 MG PO TABS: ORAL_TABLET | ORAL | 5 refills | Status: DC

## 2019-08-03 NOTE — Progress Notes (Signed)
   Subjective:    Patient ID: Melinda Chapman, female    DOB: 11-18-1965, 53 y.o.   MRN: [id number]  HPI Pt here today for med check. Pt states she is doing well. No questions or concerns this morning.  Patient states her moods overall are doing fairly well patient also relates she is trying to the best can eating healthy staying active.  Denies being stressed out.  Does need refills on her medicines.  Tries watch salt in her diet.  Does not do the flu shot but this was offered to her Virtual Visit via Telephone Note  I connected with Melinda Chapman on 08/03/19 at  8:30 AM EST by telephone and verified that I am speaking with the correct person using two identifiers.  Location: Patient: home Provider: office   I discussed the limitations, risks, security and privacy concerns of performing an evaluation and management service by telephone and the availability of in person appointments. I also discussed with the patient that there may be a patient responsible charge related to this service. The patient expressed understanding and agreed to proceed.   History of Present Illness:    Observations/Objective:   Assessment and Plan:   Follow Up Instructions:    I discussed the assessment and treatment plan with the patient. The patient was provided an opportunity to ask questions and all were answered. The patient agreed with the plan and demonstrated an understanding of the instructions.   The patient was advised to call back or seek an in-person evaluation if the symptoms worsen or if the condition fails to improve as anticipated.  I provided 16 minutes of non-face-to-face time during this encounter.   Vicente Males, LPN    Review of Systems  Constitutional: Negative for activity change, appetite change and fatigue.  HENT: Negative for congestion and rhinorrhea.   Respiratory: Negative for cough and shortness of breath.   Cardiovascular: Negative for chest pain and leg  swelling.  Gastrointestinal: Negative for abdominal pain and diarrhea.  Endocrine: Negative for polydipsia and polyphagia.  Skin: Negative for color change.  Neurological: Negative for dizziness and weakness.  Psychiatric/Behavioral: Negative for behavioral problems and confusion.       Objective:   Physical Exam  Today's visit was via telephone Physical exam was not possible for this visit      Assessment & Plan:  HTN-good control overall watch diet minimize salt continue medication  Moods overall doing well continue sertraline use lorazepam when necessary caution drowsiness  Lab work recommended.  Patient will do at her convenience in the near future  Follow-up within 6 months

## 2020-02-13 ENCOUNTER — Telehealth: Payer: Self-pay | Admitting: Family Medicine

## 2020-02-13 NOTE — Telephone Encounter (Signed)
Pt has follow up in July and would like lab work done before appt.

## 2020-02-14 NOTE — Telephone Encounter (Signed)
Lipid, liver, metabolic 7, vitamin D Hyperlipidemia hypertension vitamin D deficiency  Also hepatitis C antibody HIV antibody per CDC guidelines-please alert the patient that this is part of standard protocol 1 time for all individuals

## 2020-02-14 NOTE — Telephone Encounter (Signed)
Last labs put in and not done 08/03/19 lipid, liver, bmp, vit d

## 2020-02-15 ENCOUNTER — Other Ambulatory Visit: Payer: Self-pay | Admitting: *Deleted

## 2020-02-15 DIAGNOSIS — Z114 Encounter for screening for human immunodeficiency virus [HIV]: Secondary | ICD-10-CM

## 2020-02-15 DIAGNOSIS — E559 Vitamin D deficiency, unspecified: Secondary | ICD-10-CM

## 2020-02-15 DIAGNOSIS — Z79899 Other long term (current) drug therapy: Secondary | ICD-10-CM

## 2020-02-15 DIAGNOSIS — Z13220 Encounter for screening for lipoid disorders: Secondary | ICD-10-CM

## 2020-02-15 DIAGNOSIS — I10 Essential (primary) hypertension: Secondary | ICD-10-CM

## 2020-02-15 DIAGNOSIS — Z1159 Encounter for screening for other viral diseases: Secondary | ICD-10-CM

## 2020-02-15 NOTE — Telephone Encounter (Signed)
Labs ordered and pt notified

## 2020-02-29 DIAGNOSIS — E559 Vitamin D deficiency, unspecified: Secondary | ICD-10-CM | POA: Diagnosis not present

## 2020-02-29 DIAGNOSIS — Z79899 Other long term (current) drug therapy: Secondary | ICD-10-CM | POA: Diagnosis not present

## 2020-02-29 DIAGNOSIS — Z13220 Encounter for screening for lipoid disorders: Secondary | ICD-10-CM | POA: Diagnosis not present

## 2020-02-29 DIAGNOSIS — I10 Essential (primary) hypertension: Secondary | ICD-10-CM | POA: Diagnosis not present

## 2020-03-01 LAB — LIPID PANEL
Chol/HDL Ratio: 2.8 ratio (ref 0.0–4.4)
Cholesterol, Total: 222 mg/dL — ABNORMAL HIGH (ref 100–199)
HDL: 79 mg/dL (ref >39)
LDL Chol Calc (NIH): 120 mg/dL — ABNORMAL HIGH (ref 0–99)
Triglycerides: 136 mg/dL (ref 0–149)
VLDL Cholesterol Cal: 23 mg/dL (ref 5–40)

## 2020-03-01 LAB — HEPATIC FUNCTION PANEL
ALT: 55 IU/L — ABNORMAL HIGH (ref 0–32)
AST: 66 IU/L — ABNORMAL HIGH (ref 0–40)
Albumin: 4.9 g/dL (ref 3.8–4.9)
Alkaline Phosphatase: 85 IU/L (ref 48–121)
Bilirubin Total: 0.6 mg/dL (ref 0.0–1.2)
Bilirubin, Direct: 0.17 mg/dL (ref 0.00–0.40)
Total Protein: 7.7 g/dL (ref 6.0–8.5)

## 2020-03-01 LAB — BASIC METABOLIC PANEL
BUN/Creatinine Ratio: 10 (ref 9–23)
BUN: 6 mg/dL (ref 6–24)
CO2: 24 mmol/L (ref 20–29)
Calcium: 10 mg/dL (ref 8.7–10.2)
Chloride: 96 mmol/L (ref 96–106)
Creatinine, Ser: 0.62 mg/dL (ref 0.57–1.00)
GFR calc Af Amer: 117 mL/min/{1.73_m2} (ref >59)
GFR calc non Af Amer: 102 mL/min/{1.73_m2} (ref >59)
Glucose: 99 mg/dL (ref 65–99)
Potassium: 5.1 mmol/L (ref 3.5–5.2)
Sodium: 136 mmol/L (ref 134–144)

## 2020-03-01 LAB — HEPATITIS C ANTIBODY: Hep C Virus Ab: <0.1 s/co ratio (ref 0.0–0.9)

## 2020-03-01 LAB — HIV ANTIBODY (ROUTINE TESTING W REFLEX): HIV Screen 4th Generation wRfx: NONREACTIVE

## 2020-03-01 LAB — VITAMIN D 25 HYDROXY (VIT D DEFICIENCY, FRACTURES): Vit D, 25-Hydroxy: 16.4 ng/mL — ABNORMAL LOW (ref 30.0–100.0)

## 2020-03-05 ENCOUNTER — Ambulatory Visit: Payer: Self-pay | Admitting: Family Medicine

## 2020-03-05 ENCOUNTER — Encounter: Payer: Self-pay | Admitting: Family Medicine

## 2020-03-05 ENCOUNTER — Other Ambulatory Visit: Payer: Self-pay

## 2020-03-05 VITALS — BP 128/72 | Temp 96.6°F | Wt 114.2 lb

## 2020-03-05 DIAGNOSIS — R1084 Generalized abdominal pain: Secondary | ICD-10-CM

## 2020-03-05 DIAGNOSIS — Z720 Tobacco use: Secondary | ICD-10-CM

## 2020-03-05 DIAGNOSIS — E785 Hyperlipidemia, unspecified: Secondary | ICD-10-CM | POA: Diagnosis not present

## 2020-03-05 DIAGNOSIS — R7401 Elevation of levels of liver transaminase levels: Secondary | ICD-10-CM | POA: Diagnosis not present

## 2020-03-05 DIAGNOSIS — I10 Essential (primary) hypertension: Secondary | ICD-10-CM

## 2020-03-05 HISTORY — DX: Hyperlipidemia, unspecified: E78.5

## 2020-03-05 NOTE — Progress Notes (Signed)
   Subjective:    Patient ID: Melinda Chapman, female    DOB: 12/14/1965, 54 y.o.   MRN: [id number]  Hypertension This is a chronic problem. Pertinent negatives include no chest pain or shortness of breath. Compliance problems: pt does not check BP at home; no issues.   Pt here for follow up. Pt states no issues/no concerns.     Review of Systems  Constitutional: Negative for activity change, appetite change and fatigue.  HENT: Negative for congestion and rhinorrhea.   Respiratory: Negative for cough and shortness of breath.   Cardiovascular: Negative for chest pain and leg swelling.  Gastrointestinal: Negative for abdominal pain and diarrhea.  Endocrine: Negative for polydipsia and polyphagia.  Skin: Negative for color change.  Neurological: Negative for dizziness and weakness.  Psychiatric/Behavioral: Negative for behavioral problems and confusion.       Objective:   Physical Exam Vitals reviewed.  Constitutional:      General: She is not in acute distress. HENT:     Head: Normocephalic and atraumatic.  Eyes:     General:        Right eye: No discharge.        Left eye: No discharge.  Neck:     Trachea: No tracheal deviation.  Cardiovascular:     Rate and Rhythm: Normal rate and regular rhythm.     Heart sounds: Normal heart sounds. No murmur heard.   Pulmonary:     Effort: Pulmonary effort is normal. No respiratory distress.     Breath sounds: Normal breath sounds.  Lymphadenopathy:     Cervical: No cervical adenopathy.  Skin:    General: Skin is warm and dry.  Neurological:     Mental Status: She is alert.     Coordination: Coordination normal.  Psychiatric:        Behavior: Behavior normal.           Assessment & Plan:  1. Essential hypertension, benign Blood pressure decent control watch diet stay physically active - Hepatic function panel - Lipase - Ambulatory Referral for Lung Cancer Scre - US Abdomen Complete  2. Hyperlipidemia, unspecified  hyperlipidemia type Significant elevated cholesterol very important to get this under control dietary measures discussed relook at this again in 6 months time - Hepatic function panel - Lipase - Ambulatory Referral for Lung Cancer Scre - US Abdomen Complete  3. Tobacco abuse Patient was counseled to quit smoking very important to do so to lower her risk of cancer - Hepatic function panel - Lipase - Ambulatory Referral for Lung Cancer Scre - US Abdomen Complete  4. Elevated transaminase level Elevated liver enzymes patient states she will cut out alco she will do ultrasound because she has had some intermittent right upper quadrant pain she is already had a cholecystectomy thank you hol she will repeat liver function and pancreas function in approximately 6 to 8 weeks - Hepatic function panel - Lipase - Ambulatory Referral for Lung Cancer Scre - US Abdomen Complete  5. Generalized abdominal pain Abdominal pain mid abdomen and right upper quadrant patient was encouraged to quit alcohol at the very least cut back dramatically very important for patient to do follow-up lab work and ultrasound - Hepatic function panel - Lipase - Ambulatory Referral for Lung Cancer Scre - US Abdomen Complete  Follow-up 6 weeks after the ultrasound and the lab work

## 2020-03-06 ENCOUNTER — Encounter (HOSPITAL_COMMUNITY): Payer: Self-pay

## 2020-03-06 ENCOUNTER — Other Ambulatory Visit (HOSPITAL_COMMUNITY): Payer: Self-pay

## 2020-03-06 DIAGNOSIS — Z87891 Personal history of nicotine dependence: Secondary | ICD-10-CM

## 2020-03-06 DIAGNOSIS — Z122 Encounter for screening for malignant neoplasm of respiratory organs: Secondary | ICD-10-CM

## 2020-03-06 NOTE — Progress Notes (Signed)
Received referral for initial lung cancer screening scan. Contacted patient and obtained smoking history (started age 54 smoking a 0.5ppd until age 2 when she began smoking 1ppd and still smokes 1ppd, current smoker, 31 pack year history) as well as answering questions related to the screening process. Patient denies signs and symptoms of lung cancer such as weight loss or hemoptysis. Patient denies comorbidity that would prevent curative treatment if lung cancer were to be found. Patient is scheduled for shared decision making visit and CT scan on 03/22/2020 at 0930.

## 2020-03-11 ENCOUNTER — Other Ambulatory Visit: Payer: Self-pay

## 2020-03-11 ENCOUNTER — Ambulatory Visit (HOSPITAL_COMMUNITY)
Admission: RE | Admit: 2020-03-11 | Discharge: 2020-03-11 | Disposition: A | Discharge status: Home / Self Care | Payer: BC Managed Care – PPO | Source: Ambulatory Visit | Attending: Family Medicine | Admitting: Family Medicine

## 2020-03-11 DIAGNOSIS — I10 Essential (primary) hypertension: Secondary | ICD-10-CM | POA: Diagnosis not present

## 2020-03-11 DIAGNOSIS — R7401 Elevation of levels of liver transaminase levels: Secondary | ICD-10-CM | POA: Insufficient documentation

## 2020-03-11 DIAGNOSIS — Z9049 Acquired absence of other specified parts of digestive tract: Secondary | ICD-10-CM | POA: Diagnosis not present

## 2020-03-11 DIAGNOSIS — Z720 Tobacco use: Secondary | ICD-10-CM | POA: Insufficient documentation

## 2020-03-11 DIAGNOSIS — E785 Hyperlipidemia, unspecified: Secondary | ICD-10-CM | POA: Diagnosis not present

## 2020-03-11 DIAGNOSIS — R1084 Generalized abdominal pain: Secondary | ICD-10-CM | POA: Insufficient documentation

## 2020-03-12 MED ORDER — SERTRALINE HCL 25 MG PO TABS: ORAL_TABLET | ORAL | 5 refills | Status: DC

## 2020-03-12 MED ORDER — LORAZEPAM 0.5 MG PO TABS: ORAL_TABLET | ORAL | 0 refills | Status: DC

## 2020-03-12 MED ORDER — ESOMEPRAZOLE MAGNESIUM 40 MG PO CPDR: DELAYED_RELEASE_CAPSULE | ORAL | 5 refills | Status: DC

## 2020-03-12 MED ORDER — LISINOPRIL 20 MG PO TABS: 20.0000 mg | ORAL_TABLET | Freq: Every day | ORAL | 5 refills | Status: DC

## 2020-03-18 ENCOUNTER — Other Ambulatory Visit (HOSPITAL_COMMUNITY): Payer: Self-pay | Admitting: Family Medicine

## 2020-03-18 DIAGNOSIS — Z1231 Encounter for screening mammogram for malignant neoplasm of breast: Secondary | ICD-10-CM

## 2020-03-21 ENCOUNTER — Ambulatory Visit (HOSPITAL_COMMUNITY)
Admission: RE | Admit: 2020-03-21 | Discharge: 2020-03-21 | Disposition: A | Discharge status: Home / Self Care | Payer: BC Managed Care – PPO | Source: Ambulatory Visit | Attending: Family Medicine | Admitting: Family Medicine

## 2020-03-21 ENCOUNTER — Other Ambulatory Visit: Payer: Self-pay

## 2020-03-21 DIAGNOSIS — Z87891 Personal history of nicotine dependence: Secondary | ICD-10-CM | POA: Insufficient documentation

## 2020-03-21 DIAGNOSIS — Z122 Encounter for screening for malignant neoplasm of respiratory organs: Secondary | ICD-10-CM | POA: Insufficient documentation

## 2020-03-21 DIAGNOSIS — Z1231 Encounter for screening mammogram for malignant neoplasm of breast: Secondary | ICD-10-CM | POA: Diagnosis not present

## 2020-03-22 ENCOUNTER — Ambulatory Visit (HOSPITAL_COMMUNITY)
Admission: RE | Admit: 2020-03-22 | Discharge: 2020-03-22 | Disposition: A | Discharge status: Home / Self Care | Payer: BC Managed Care – PPO | Source: Ambulatory Visit | Attending: Nurse Practitioner | Admitting: Nurse Practitioner

## 2020-03-22 ENCOUNTER — Inpatient Hospital Stay (HOSPITAL_COMMUNITY): Payer: BC Managed Care – PPO | Attending: Nurse Practitioner | Admitting: Nurse Practitioner

## 2020-03-22 DIAGNOSIS — F17210 Nicotine dependence, cigarettes, uncomplicated: Secondary | ICD-10-CM | POA: Diagnosis not present

## 2020-03-22 DIAGNOSIS — Z720 Tobacco use: Secondary | ICD-10-CM

## 2020-03-22 DIAGNOSIS — Z122 Encounter for screening for malignant neoplasm of respiratory organs: Secondary | ICD-10-CM

## 2020-03-22 DIAGNOSIS — Z1231 Encounter for screening mammogram for malignant neoplasm of breast: Secondary | ICD-10-CM | POA: Diagnosis not present

## 2020-03-22 DIAGNOSIS — Z87891 Personal history of nicotine dependence: Secondary | ICD-10-CM | POA: Diagnosis not present

## 2020-03-22 NOTE — Assessment & Plan Note (Signed)
1.  Tobacco abuse: -This patient meets criteria for low-dose CT lung cancer screening. -She is asymptomatic for any signs or symptoms of lung cancer. -This shared decision making visit discussion included risk and benefits of screening, potential for follow-up, diagnostic testing for abnormal scans, potential for false positive test, over diagnosis, and discussion about total radiation exposure. -Patient stated willingness to undergo diagnostics and treatment as needed. -Patient was counseled on smoking cessation to decrease risk of lung cancer, pulmonary disease, heart disease and stroke. -Patient was given a resource card with information on receiving free nicotine replacement therapy and information about free smoking cessation classes. -Patient will present today for LD CT scan today and follow-up with PCP.

## 2020-03-22 NOTE — Progress Notes (Signed)
[facility] CONSULT NOTE  Patient Care Team: Kathyrn Drown, MD as PCP - General (Family Medicine) Harl Bowie, Alphonse Guild, MD as PCP - Cardiology (Cardiology)  CHIEF COMPLAINTS/PURPOSE OF CONSULTATION: Shared decision making visit for lung cancer screening  HISTORY OF PRESENTING ILLNESS:  Melinda Chapman 54 y.o. female is here today for shared decision making visit for lung cancer screening.  She presents with a past medical history of anxiety, constipation, diabetes, diarrhea, hyperlipidemia, and hypertension.  Patient started smoking at the age of 34.  She smokes approximately 1 pack/day.  She is a current smoker.  She has a 31-pack-year history.  She does report occasional cough without sputum production.  She denies any shortness of breath on exertion.  She denies any alcohol or illicit drug use.  MEDICAL HISTORY:  Past Medical History:  Diagnosis Date   Anxiety    Constipation    Diabetes in pregnancy    Diarrhea    GERD (gastroesophageal reflux disease)    Hyperlipidemia 03/05/2020   Hypertension    IBS (irritable bowel syndrome)    Papanicolaou smear of cervix with positive high risk human papilloma virus (HPV) test 08/12/2017    SURGICAL HISTORY: Past Surgical History:  Procedure Laterality Date   [place], 2002   x 2   CHOLECYSTECTOMY  2000   in [place]   TUBAL LIGATION  2002    SOCIAL HISTORY: Social History   Socioeconomic History   Marital status: Legally Separated    Spouse name: Not on file   Number of children: 2   Years of education: Not on file   Highest education level: Not on file  Occupational History   Occupation: Network engineer  Tobacco Use   Smoking status: Current Every Day Smoker    Packs/day: 0.50    Years: 30.00    Pack years: 15.00    Types: Cigarettes   Smokeless tobacco: Never Used  Substance and Sexual Activity   Alcohol use: Yes     Alcohol/week: 5.0 standard drinks    Types: 5 Cans of beer per week    Comment: occasional   Drug use: No   Sexual activity: Not Currently    Birth control/protection: Surgical    Comment: tubal and ablation  Other Topics Concern   Not on file  Social History Narrative   4 caffeine drinks daily    Social Determinants of Health   Financial Resource Strain:    Difficulty of Paying Living Expenses:   Food Insecurity:    Worried About Charity fundraiser in the Last Year:    Arboriculturist in the Last Year:   Transportation Needs:    Film/video editor (Medical):    Lack of Transportation (Non-Medical):   Physical Activity:    Days of Exercise per Week:    Minutes of Exercise per Session:   Stress:    Feeling of Stress :   Social Connections:    Frequency of Communication with Friends and Family:    Frequency of Social Gatherings with Friends and Family:    Attends Religious Services:    Active Member of Clubs or Organizations:    Attends Archivist Meetings:    Marital Status:   Intimate Partner Violence:    Fear of Current or Ex-Partner:    Emotionally Abused:    Physically Abused:    Sexually Abused:  FAMILY HISTORY: Family History  Problem Relation Age of Onset   Colon cancer Paternal Grandmother    Hypertension Mother    Hyperlipidemia Mother    Colon polyps Father    Colon polyps Brother    Colon polyps Paternal Aunt    Esophageal cancer Neg Hx    Stomach cancer Neg Hx     ALLERGIES:  has No Known Allergies.  MEDICATIONS:  Current Outpatient Medications  Medication Sig Dispense Refill   esomeprazole (NEXIUM) 40 MG capsule TAKE (1) CAPSULE BY MOUTH DAILY. 30 capsule 5   lisinopril (ZESTRIL) 20 MG tablet Take 1 tablet (20 mg total) by mouth daily. 30 tablet 5   LORazepam (ATIVAN) 0.5 MG tablet 1/2 to 1 bid prn 30 tablet 0   Multiple Vitamin (MULTIVITAMIN ADULT PO) Take by mouth.     sertraline (ZOLOFT)  25 MG tablet 1/2 or 1 as directed daily 30 tablet 5   No current facility-administered medications for this visit.     LABORATORY DATA:  I have reviewed the data as listed Lab Results  Component Value Date   WBC 11.0 (H) 02/24/2013   HGB 17.7 (H) 02/24/2013   HCT 48.9 (H) 02/24/2013   MCV 94.2 02/24/2013   PLT 164 02/24/2013     Chemistry      Component Value Date/Time   NA 136 02/29/2020 0834   K 5.1 02/29/2020 0834   CL 96 02/29/2020 0834   CO2 24 02/29/2020 0834   BUN 6 02/29/2020 0834   CREATININE 0.62 02/29/2020 0834   CREATININE 0.59 06/08/2014 1008      Component Value Date/Time   CALCIUM 10.0 02/29/2020 0834   ALKPHOS 85 02/29/2020 0834   AST 66 (H) 02/29/2020 0834   ALT 55 (H) 02/29/2020 0834   BILITOT 0.6 02/29/2020 0834       RADIOGRAPHIC STUDIES: I have personally reviewed the radiological images as listed and agreed with the findings in the report. US Abdomen Complete  Result Date: 03/11/2020 CLINICAL DATA:  Abdominal pain EXAM: ABDOMEN ULTRASOUND COMPLETE COMPARISON:  Jan 10, 2004 FINDINGS: Gallbladder: Surgically absent. Common bile duct: Diameter: 8 mm, within normal limits for post cholecystectomy state. No intrahepatic, common hepatic, or common bile duct dilatation given post cholecystectomy state. Liver: No focal lesion identified. Within normal limits in parenchymal echogenicity. Portal vein is patent on color Doppler imaging with normal direction of blood flow towards the liver. IVC: No abnormality visualized. Pancreas: No pancreatic mass or inflammatory focus. Spleen: Size and appearance within normal limits. Right Kidney: Length: 10.5 cm. Echogenicity within normal limits. No mass or hydronephrosis visualized. Left Kidney: Length: 10.2 cm. Echogenicity within normal limits. No mass or hydronephrosis visualized. Abdominal aorta: No aneurysm visualized. Other findings: No demonstrable ascites. IMPRESSION: Gallbladder absent.  Study otherwise  unremarkable. Electronically Signed   By: Lowella Grip III M.D.   On: 03/11/2020 08:04    ASSESSMENT & PLAN:  Tobacco abuse 1.  Tobacco abuse: -This patient meets criteria for low-dose CT lung cancer screening. -She is asymptomatic for any signs or symptoms of lung cancer. -This shared decision making visit discussion included risk and benefits of screening, potential for follow-up, diagnostic testing for abnormal scans, potential for false positive test, over diagnosis, and discussion about total radiation exposure. -Patient stated willingness to undergo diagnostics and treatment as needed. -Patient was counseled on smoking cessation to decrease risk of lung cancer, pulmonary disease, heart disease and stroke. -Patient was given a resource card with information on receiving free  nicotine replacement therapy and information about free smoking cessation classes. -Patient will present today for LD CT scan today and follow-up with PCP.   All questions were answered. The patient knows to call the clinic with any problems, questions or concerns. I spent 10 minutes counseling the patient face to face. The total time spent in the appointment was 20 minutes and more than 50% was on counseling.     Glennie Isle, NP-C 03/22/2020 9:32 AM

## 2020-03-25 ENCOUNTER — Encounter (HOSPITAL_COMMUNITY): Payer: Self-pay

## 2020-03-25 NOTE — Progress Notes (Signed)
Patient enrolled in the Lung Cancer Screening Program. SDMV and initial LDCT was Friday, August 6th. Patient's results were mailed to the patient's home with my contact information provided so that the patient may call with any questions. Patient is LRADS 2 and will follow-up in 12 months. I will reach out to patient to schedule follow-up in approximately 11 months. Patient's PCP aware of results, which are as follows:  IMPRESSION: Lung-RADS 2, benign appearance or behavior. Continue annual screening with low-dose chest CT without contrast in 12 months.  Aortic Atherosclerosis (ICD10-I70.0).

## 2020-03-26 ENCOUNTER — Ambulatory Visit (INDEPENDENT_AMBULATORY_CARE_PROVIDER_SITE_OTHER): Payer: BC Managed Care – PPO | Admitting: Adult Health

## 2020-03-26 ENCOUNTER — Other Ambulatory Visit (HOSPITAL_COMMUNITY)
Admission: RE | Admit: 2020-03-26 | Discharge: 2020-03-26 | Disposition: A | Discharge status: Home / Self Care | Payer: BC Managed Care – PPO | Source: Ambulatory Visit | Attending: Adult Health | Admitting: Adult Health

## 2020-03-26 ENCOUNTER — Encounter: Payer: Self-pay | Admitting: Adult Health

## 2020-03-26 VITALS — BP 133/77 | HR 70 | Ht 60.0 in | Wt 113.0 lb

## 2020-03-26 DIAGNOSIS — Z01419 Encounter for gynecological examination (general) (routine) without abnormal findings: Secondary | ICD-10-CM

## 2020-03-26 DIAGNOSIS — Z1211 Encounter for screening for malignant neoplasm of colon: Secondary | ICD-10-CM | POA: Diagnosis not present

## 2020-03-26 LAB — HEMOCCULT GUIAC POC 1CARD (OFFICE): Fecal Occult Blood, POC: NEGATIVE

## 2020-03-26 NOTE — Progress Notes (Signed)
Patient ID: Melinda Chapman, female   DOB: [birth year], 54 y.o.   MRN: [id number] History of Present Illness: Melinda Chapman is a 54 year old white female,separated, PM,in for a well woman gyn exam and pap. 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  She has had elevated liver enzymes and is following up with PCP. PCP is Dr Sallee Lange.    Current Medications, Allergies, Past Medical History, Past Surgical History, Family History and Social History were reviewed in [employer] record.     Review of Systems: Patient denies any headaches, hearing loss, fatigue, blurred vision, shortness of breath, chest pain, abdominal pain, problems with bowel movements, urination, or intercourse(not active). No mood swings. Has occasional joint pain   Physical Exam:BP 133/77 (BP Location: Left Arm, Patient Position: Sitting, Cuff Size: Normal)   Pulse 70   Ht 5' (1.524 m)   Wt 113 lb (51.3 kg)   BMI 22.07 kg/m  General:  Well developed, well nourished, no acute distress Skin:  Warm and dry Neck:  Midline trachea, normal thyroid, good ROM, no lymphadenopathy Lungs; Clear to auscultation bilaterally Breast:  No dominant palpable mass, retraction, or nipple discharge Cardiovascular: Regular rate and rhythm Abdomen:  Soft, non tender, no hepatosplenomegaly Pelvic:  External genitalia is normal in appearance, no lesions.  The vagina is pale with loss of moisture and rugae. Urethra has no lesions or masses. The cervix is smooth,pap with high risk HPV genotyping performed.   Uterus is felt to be normal size, shape, and contour.  No adnexal masses or tenderness noted.Bladder is non tender, no masses felt. Rectal: Good sphincter tone, no polyps, or hemorrhoids felt.  Hemoccult negative. Extremities/musculoskeletal:  No swelling or varicosities noted, no clubbing or cyanosis Psych:  No mood changes, alert and cooperative,seems  happy AA is 4 Fall risk is low PHQ 9 score is 0  Upstream - 03/26/20 1126      Pregnancy Intention Screening   Does the patient want to become pregnant in the next year? No    Does the patient's partner want to become pregnant in the next year? No    Would the patient like to discuss contraceptive options today? No      Contraception Wrap Up   Current Method Female Sterilization    End Method Female Sterilization    Contraception Counseling Provided No         Examination chaperoned by Celene Squibb LPN  Impression and Plan: 1. Encounter for gynecological examination with Papanicolaou smear of cervix Pap sent Physical in 1 year Pap in 3 if normal Mammogram yearly Labs with PCP  Colonoscopy  per GI   2. Encounter for screening fecal occult blood testing

## 2020-03-27 LAB — CYTOLOGY - PAP
Comment: NEGATIVE
Diagnosis: NEGATIVE
High risk HPV: NEGATIVE

## 2020-04-11 DIAGNOSIS — E785 Hyperlipidemia, unspecified: Secondary | ICD-10-CM | POA: Diagnosis not present

## 2020-04-11 DIAGNOSIS — R7401 Elevation of levels of liver transaminase levels: Secondary | ICD-10-CM | POA: Diagnosis not present

## 2020-04-11 DIAGNOSIS — I10 Essential (primary) hypertension: Secondary | ICD-10-CM | POA: Diagnosis not present

## 2020-04-11 DIAGNOSIS — Z720 Tobacco use: Secondary | ICD-10-CM | POA: Diagnosis not present

## 2020-04-12 LAB — LIPASE: Lipase: 36 U/L (ref 14–72)

## 2020-04-12 LAB — HEPATIC FUNCTION PANEL
ALT: 11 IU/L (ref 0–32)
AST: 18 IU/L (ref 0–40)
Albumin: 5 g/dL — ABNORMAL HIGH (ref 3.8–4.9)
Alkaline Phosphatase: 74 IU/L (ref 48–121)
Bilirubin Total: 0.6 mg/dL (ref 0.0–1.2)
Bilirubin, Direct: 0.14 mg/dL (ref 0.00–0.40)
Total Protein: 7.3 g/dL (ref 6.0–8.5)

## 2020-04-16 ENCOUNTER — Other Ambulatory Visit: Payer: Self-pay

## 2020-04-16 ENCOUNTER — Ambulatory Visit: Payer: BC Managed Care – PPO | Admitting: Family Medicine

## 2020-04-16 VITALS — BP 122/72 | HR 74 | Temp 97.6°F | Wt 113.4 lb

## 2020-04-16 DIAGNOSIS — I10 Essential (primary) hypertension: Secondary | ICD-10-CM

## 2020-04-16 DIAGNOSIS — E785 Hyperlipidemia, unspecified: Secondary | ICD-10-CM

## 2020-04-16 DIAGNOSIS — I700 Atherosclerosis of aorta: Secondary | ICD-10-CM

## 2020-04-16 MED ORDER — ROSUVASTATIN CALCIUM 5 MG PO TABS: 5.0000 mg | ORAL_TABLET | Freq: Every day | ORAL | 5 refills | Status: DC

## 2020-04-16 NOTE — Progress Notes (Signed)
   Subjective:    Patient ID: Melinda Chapman, female    DOB: [birth year], 54 y.o.   MRN: [id number]  HPI  Patient arrives for a follow up on Blood pressure and discuss blood work. Patient here for follow-up She recently had CT scan due to her smoking Show minimal pulmonary nodule she will repeat her CT scan in 1 year Patient has been encouraged to quit smoking We also encourage patient to get Covid vaccine and discussed the importance of this Patient will consider In addition to this scan did show aortic atherosclerosis the importance of getting cholesterol under control was discussed Review of Systems  Constitutional: Negative for activity change, appetite change and fatigue.  HENT: Negative for congestion and rhinorrhea.   Respiratory: Negative for cough and shortness of breath.   Cardiovascular: Negative for chest pain and leg swelling.  Gastrointestinal: Negative for abdominal pain and diarrhea.  Endocrine: Negative for polydipsia and polyphagia.  Skin: Negative for color change.  Neurological: Negative for dizziness and weakness.  Psychiatric/Behavioral: Negative for behavioral problems and confusion.       Objective:   Physical Exam Vitals reviewed.  Constitutional:      General: She is not in acute distress. HENT:     Head: Normocephalic and atraumatic.  Eyes:     General:        Right eye: No discharge.        Left eye: No discharge.  Neck:     Trachea: No tracheal deviation.  Cardiovascular:     Rate and Rhythm: Normal rate and regular rhythm.     Heart sounds: Normal heart sounds. No murmur heard.   Pulmonary:     Effort: Pulmonary effort is normal. No respiratory distress.     Breath sounds: Normal breath sounds.  Lymphadenopathy:     Cervical: No cervical adenopathy.  Skin:    General: Skin is warm and dry.  Neurological:     Mental Status: She is alert.     Coordination: Coordination normal.  Psychiatric:        Behavior: Behavior normal.            Assessment & Plan:  Very important to get cholesterol under control because of aortic atherosclerosis Start Crestor 2 days a week gradually work her way up to every single day Repeat lipid liver in approximately 2 to 3 months Recent liver enzymes are doing better patient has cut way back on her alcohol drinking which is helped Ultrasound looked good Scan did show aortic sclerosis as well as some small pulmonary nodule she will repeat the scan in 1 year she will work hard at quitting smoking she will strongly consider getting Covid vaccine  Follow-up in 6 months

## 2020-04-16 NOTE — Patient Instructions (Signed)
Do your labs in mid november

## 2020-04-18 DIAGNOSIS — H5213 Myopia, bilateral: Secondary | ICD-10-CM | POA: Diagnosis not present

## 2020-04-21 ENCOUNTER — Encounter: Payer: Self-pay | Admitting: Family Medicine

## 2020-04-21 DIAGNOSIS — I700 Atherosclerosis of aorta: Secondary | ICD-10-CM

## 2020-04-21 HISTORY — DX: Atherosclerosis of aorta: I70.0

## 2020-08-20 ENCOUNTER — Other Ambulatory Visit: Payer: Self-pay | Admitting: Family Medicine

## 2020-08-23 DIAGNOSIS — R002 Palpitations: Secondary | ICD-10-CM | POA: Diagnosis not present

## 2020-08-23 DIAGNOSIS — I499 Cardiac arrhythmia, unspecified: Secondary | ICD-10-CM | POA: Diagnosis not present

## 2020-08-23 DIAGNOSIS — I10 Essential (primary) hypertension: Secondary | ICD-10-CM | POA: Diagnosis not present

## 2020-12-11 ENCOUNTER — Encounter: Payer: Self-pay | Admitting: Family Medicine

## 2020-12-11 DIAGNOSIS — E785 Hyperlipidemia, unspecified: Secondary | ICD-10-CM

## 2020-12-11 DIAGNOSIS — I10 Essential (primary) hypertension: Secondary | ICD-10-CM

## 2020-12-12 NOTE — Telephone Encounter (Signed)
Nurses Please order metabolic 7, lipid, liver HTN hyperlipidemia Talani can do these before her visit if possible so we can discuss these at the time of her visit thank you

## 2020-12-12 NOTE — Addendum Note (Signed)
Addended by: Vicente Males on: 12/12/2020 08:33 AM   Modules accepted: Orders

## 2020-12-13 DIAGNOSIS — I10 Essential (primary) hypertension: Secondary | ICD-10-CM | POA: Diagnosis not present

## 2020-12-13 DIAGNOSIS — E785 Hyperlipidemia, unspecified: Secondary | ICD-10-CM | POA: Diagnosis not present

## 2020-12-14 LAB — BASIC METABOLIC PANEL
BUN/Creatinine Ratio: 14 (ref 9–23)
BUN: 8 mg/dL (ref 6–24)
CO2: 24 mmol/L (ref 20–29)
Calcium: 9.6 mg/dL (ref 8.7–10.2)
Chloride: 93 mmol/L — ABNORMAL LOW (ref 96–106)
Creatinine, Ser: 0.59 mg/dL (ref 0.57–1.00)
Glucose: 102 mg/dL — ABNORMAL HIGH (ref 65–99)
Potassium: 4.3 mmol/L (ref 3.5–5.2)
Sodium: 132 mmol/L — ABNORMAL LOW (ref 134–144)
eGFR: 106 mL/min/{1.73_m2} (ref >59)

## 2020-12-14 LAB — HEPATIC FUNCTION PANEL
ALT: 21 IU/L (ref 0–32)
AST: 26 IU/L (ref 0–40)
Albumin: 4.7 g/dL (ref 3.8–4.9)
Alkaline Phosphatase: 82 IU/L (ref 44–121)
Bilirubin Total: 0.5 mg/dL (ref 0.0–1.2)
Bilirubin, Direct: 0.16 mg/dL (ref 0.00–0.40)
Total Protein: 7.1 g/dL (ref 6.0–8.5)

## 2020-12-14 LAB — LIPID PANEL
Chol/HDL Ratio: 3.3 ratio (ref 0.0–4.4)
Cholesterol, Total: 221 mg/dL — ABNORMAL HIGH (ref 100–199)
HDL: 66 mg/dL (ref >39)
LDL Chol Calc (NIH): 140 mg/dL — ABNORMAL HIGH (ref 0–99)
Triglycerides: 87 mg/dL (ref 0–149)
VLDL Cholesterol Cal: 15 mg/dL (ref 5–40)

## 2020-12-18 ENCOUNTER — Ambulatory Visit: Payer: BC Managed Care – PPO | Admitting: Family Medicine

## 2020-12-18 ENCOUNTER — Other Ambulatory Visit: Payer: Self-pay

## 2020-12-18 VITALS — BP 128/76 | Temp 98.4°F | Ht 60.0 in | Wt 116.2 lb

## 2020-12-18 DIAGNOSIS — F439 Reaction to severe stress, unspecified: Secondary | ICD-10-CM

## 2020-12-18 DIAGNOSIS — E785 Hyperlipidemia, unspecified: Secondary | ICD-10-CM

## 2020-12-18 DIAGNOSIS — I700 Atherosclerosis of aorta: Secondary | ICD-10-CM

## 2020-12-18 DIAGNOSIS — I10 Essential (primary) hypertension: Secondary | ICD-10-CM

## 2020-12-18 MED ORDER — ESOMEPRAZOLE MAGNESIUM 40 MG PO CPDR: DELAYED_RELEASE_CAPSULE | ORAL | 1 refills | Status: DC

## 2020-12-18 MED ORDER — SERTRALINE HCL 25 MG PO TABS: ORAL_TABLET | ORAL | 1 refills | Status: DC

## 2020-12-18 MED ORDER — LISINOPRIL 20 MG PO TABS: 1.0000 | ORAL_TABLET | Freq: Every day | ORAL | 1 refills | Status: DC

## 2020-12-18 MED ORDER — ROSUVASTATIN CALCIUM 5 MG PO TABS: ORAL_TABLET | ORAL | 1 refills | Status: DC

## 2020-12-18 NOTE — Progress Notes (Signed)
   Subjective:    Patient ID: Melinda Chapman, female    DOB: 1966-01-11, 55 y.o.   MRN: [id number]  Hypertension This is a chronic problem. The current episode started more than 1 year ago. Risk factors for coronary artery disease include dyslipidemia. Treatments tried: lisinopril. There are no compliance problems.    Discuss recent labs. Patient states she has not taken the Crestor. Results for orders placed or performed in visit on 14/38/88  Basic Metabolic Panel (BMET)  Result Value Ref Range   Glucose 102 (H) 65 - 99 mg/dL   BUN 8 6 - 24 mg/dL   Creatinine, Ser 0.59 0.57 - 1.00 mg/dL   eGFR 106 >59 mL/min/1.73   BUN/Creatinine Ratio 14 9 - 23   Sodium 132 (L) 134 - 144 mmol/L   Potassium 4.3 3.5 - 5.2 mmol/L   Chloride 93 (L) 96 - 106 mmol/L   CO2 24 20 - 29 mmol/L   Calcium 9.6 8.7 - 10.2 mg/dL  Lipid Profile  Result Value Ref Range   Cholesterol, Total 221 (H) 100 - 199 mg/dL   Triglycerides 87 0 - 149 mg/dL   HDL 66 >39 mg/dL   VLDL Cholesterol Cal 15 5 - 40 mg/dL   LDL Chol Calc (NIH) 140 (H) 0 - 99 mg/dL   Chol/HDL Ratio 3.3 0.0 - 4.4 ratio  Hepatic function panel  Result Value Ref Range   Total Protein 7.1 6.0 - 8.5 g/dL   Albumin 4.7 3.8 - 4.9 g/dL   Bilirubin Total 0.5 0.0 - 1.2 mg/dL   Bilirubin, Direct 0.16 0.00 - 0.40 mg/dL   Alkaline Phosphatase 82 44 - 121 IU/L   AST 26 0 - 40 IU/L   ALT 21 0 - 32 IU/L   Patient has hypertension takes her medicine regular basis Also has hyperlipidemia Also has aortic atherosclerosis  Review of Systems     Objective:   Physical Exam Lungs clear heart regular pulse normal BP good  Refills were sent in patient to do follow-up labs in 8 weeks Stop statins if any side effects call us if any problems    Assessment & Plan:  Long discussion about statins patient hesitant but after discussion and the importance of reducing risk of heart attack strokes she is agreeing to try the statin she will let us know if any problems  she will check lab work again in 2 to 3 months she will follow-up here in the fall she will try to do her best to eat better Counseled to quit smoking hopefully she will do so or at least cut back Blood pressure good control continue current measures Sertraline helps with anxiety continue this for now Nexium helps her reflux no dysphagia continue this for now Aortic atherosclerosis should stay under good control as long she can get cholesterol under good control Stress levels are under good control as long as she takes her Zoloft

## 2020-12-18 NOTE — Patient Instructions (Signed)
Results for orders placed or performed in visit on 16/10/96  Basic Metabolic Panel (BMET)  Result Value Ref Range   Glucose 102 (H) 65 - 99 mg/dL   BUN 8 6 - 24 mg/dL   Creatinine, Ser 0.59 0.57 - 1.00 mg/dL   eGFR 106 >59 mL/min/1.73   BUN/Creatinine Ratio 14 9 - 23   Sodium 132 (L) 134 - 144 mmol/L   Potassium 4.3 3.5 - 5.2 mmol/L   Chloride 93 (L) 96 - 106 mmol/L   CO2 24 20 - 29 mmol/L   Calcium 9.6 8.7 - 10.2 mg/dL  Lipid Profile  Result Value Ref Range   Cholesterol, Total 221 (H) 100 - 199 mg/dL   Triglycerides 87 0 - 149 mg/dL   HDL 66 >39 mg/dL   VLDL Cholesterol Cal 15 5 - 40 mg/dL   LDL Chol Calc (NIH) 140 (H) 0 - 99 mg/dL   Chol/HDL Ratio 3.3 0.0 - 4.4 ratio  Hepatic function panel  Result Value Ref Range   Total Protein 7.1 6.0 - 8.5 g/dL   Albumin 4.7 3.8 - 4.9 g/dL   Bilirubin Total 0.5 0.0 - 1.2 mg/dL   Bilirubin, Direct 0.16 0.00 - 0.40 mg/dL   Alkaline Phosphatase 82 44 - 121 IU/L   AST 26 0 - 40 IU/L   ALT 21 0 - 32 IU/L

## 2021-03-14 DIAGNOSIS — I10 Essential (primary) hypertension: Secondary | ICD-10-CM | POA: Diagnosis not present

## 2021-03-14 DIAGNOSIS — E785 Hyperlipidemia, unspecified: Secondary | ICD-10-CM | POA: Diagnosis not present

## 2021-03-15 LAB — LIPID PANEL
Chol/HDL Ratio: 2.7 ratio (ref 0.0–4.4)
Cholesterol, Total: 191 mg/dL (ref 100–199)
HDL: 70 mg/dL (ref >39)
LDL Chol Calc (NIH): 101 mg/dL — ABNORMAL HIGH (ref 0–99)
Triglycerides: 117 mg/dL (ref 0–149)
VLDL Cholesterol Cal: 20 mg/dL (ref 5–40)

## 2021-03-15 LAB — HEPATIC FUNCTION PANEL
ALT: 29 IU/L (ref 0–32)
AST: 34 IU/L (ref 0–40)
Albumin: 4.9 g/dL (ref 3.8–4.9)
Alkaline Phosphatase: 81 IU/L (ref 44–121)
Bilirubin Total: 0.6 mg/dL (ref 0.0–1.2)
Bilirubin, Direct: 0.17 mg/dL (ref 0.00–0.40)
Total Protein: 7.2 g/dL (ref 6.0–8.5)

## 2021-06-11 ENCOUNTER — Other Ambulatory Visit (HOSPITAL_COMMUNITY): Payer: Self-pay

## 2021-06-11 DIAGNOSIS — Z87891 Personal history of nicotine dependence: Secondary | ICD-10-CM

## 2021-06-11 DIAGNOSIS — Z122 Encounter for screening for malignant neoplasm of respiratory organs: Secondary | ICD-10-CM

## 2021-06-11 NOTE — Progress Notes (Signed)
Patient scheduled for follow-up LDCT on 07/11/21 at 1500.

## 2021-06-20 ENCOUNTER — Encounter: Payer: BC Managed Care – PPO | Admitting: Family Medicine

## 2021-07-01 ENCOUNTER — Ambulatory Visit (INDEPENDENT_AMBULATORY_CARE_PROVIDER_SITE_OTHER): Payer: BC Managed Care – PPO | Admitting: Family Medicine

## 2021-07-01 ENCOUNTER — Encounter: Payer: Self-pay | Admitting: Family Medicine

## 2021-07-01 ENCOUNTER — Other Ambulatory Visit: Payer: Self-pay

## 2021-07-01 VITALS — BP 138/80 | Temp 97.5°F | Ht 60.0 in | Wt 121.2 lb

## 2021-07-01 DIAGNOSIS — Z0001 Encounter for general adult medical examination with abnormal findings: Secondary | ICD-10-CM | POA: Diagnosis not present

## 2021-07-01 DIAGNOSIS — I700 Atherosclerosis of aorta: Secondary | ICD-10-CM

## 2021-07-01 DIAGNOSIS — Z720 Tobacco use: Secondary | ICD-10-CM

## 2021-07-01 DIAGNOSIS — Z79899 Other long term (current) drug therapy: Secondary | ICD-10-CM

## 2021-07-01 DIAGNOSIS — I10 Essential (primary) hypertension: Secondary | ICD-10-CM

## 2021-07-01 DIAGNOSIS — E785 Hyperlipidemia, unspecified: Secondary | ICD-10-CM | POA: Diagnosis not present

## 2021-07-01 DIAGNOSIS — Z0000 Encounter for general adult medical examination without abnormal findings: Secondary | ICD-10-CM

## 2021-07-01 DIAGNOSIS — M7501 Adhesive capsulitis of right shoulder: Secondary | ICD-10-CM | POA: Diagnosis not present

## 2021-07-01 MED ORDER — ROSUVASTATIN CALCIUM 5 MG PO TABS: ORAL_TABLET | ORAL | 1 refills | Status: DC

## 2021-07-01 MED ORDER — LORAZEPAM 0.5 MG PO TABS: ORAL_TABLET | ORAL | 0 refills | Status: DC

## 2021-07-01 MED ORDER — LISINOPRIL 20 MG PO TABS: 20.0000 mg | ORAL_TABLET | Freq: Every day | ORAL | 1 refills | Status: DC

## 2021-07-01 MED ORDER — SERTRALINE HCL 25 MG PO TABS: ORAL_TABLET | ORAL | 1 refills | Status: DC

## 2021-07-01 MED ORDER — ESOMEPRAZOLE MAGNESIUM 40 MG PO CPDR: DELAYED_RELEASE_CAPSULE | ORAL | 1 refills | Status: DC

## 2021-07-01 NOTE — Patient Instructions (Signed)

## 2021-07-01 NOTE — Progress Notes (Signed)
   Subjective:    Patient ID: Melinda Chapman, female    DOB: May 14, 1966, 55 y.o.   MRN: [id number]  HPI The patient comes in today for a wellness visit.    A review of their health history was completed.  A review of medications was also completed.  Any needed refills; all chronic meds   Eating habits: healthy eating  Falls/  MVA accidents in past few months: none  Regular exercise:  walks a little bit  Specialist pt sees on regular basis: none  Preventative health issues were discussed.   Additional concerns:  right shoulder-unable to lift arm. Pain radiates to elbow. Having trouble bathing, dressing and brushing hair and is waking her up at night. Has tried everything to help. Going on for about 6 months.    Review of Systems     Objective:   Physical Exam  General-in no acute distress Eyes-no discharge Lungs-respiratory rate normal, CTA CV-no murmurs,RRR Extremities skin warm dry no edema Neuro grossly normal Behavior normal, alert       Assessment & Plan:  1. Well adult exam Adult wellness-complete.wellness physical was conducted today. Importance of diet and exercise were discussed in detail.  In addition to this a discussion regarding safety was also covered. We also reviewed over immunizations and gave recommendations regarding current immunization needed for age.  In addition to this additional areas were also touched on including: Preventative health exams needed:  Colonoscopy patient will do this in the spring 2023  Patient was advised yearly wellness exam  - Basic Metabolic Panel (BMET) - Lipid Profile - Hepatic function panel  2. Adhesive capsulitis of right shoulder Limited range of motion recommend physical therapy recommend referral to orthopedics Dr Amedeo Kinsman - Ambulatory referral to Physical Therapy - Ambulatory referral to Orthopedic Surgery  3. High risk medication use Labs ordered - Basic Metabolic Panel (BMET) - Lipid Profile -  Hepatic function panel  4. Hyperlipidemia, unspecified hyperlipidemia type Healthy diet regular physical activity continue rosuvastatin - Basic Metabolic Panel (BMET) - Lipid Profile - Hepatic function panel  5. Essential hypertension, benign Blood pressure decent control continue current measures watch diet - Basic Metabolic Panel (BMET) - Lipid Profile - Hepatic function panel  Patient was encouraged strongly to quit smoking.  Risk of smoking discussed in detail.

## 2021-07-03 ENCOUNTER — Telehealth: Payer: Self-pay | Admitting: Family Medicine

## 2021-07-03 DIAGNOSIS — M7501 Adhesive capsulitis of right shoulder: Secondary | ICD-10-CM

## 2021-07-03 NOTE — Telephone Encounter (Signed)
Referral ordered in Epic. 

## 2021-07-03 NOTE — Telephone Encounter (Signed)
Melinda Chapman at [facility] asked that "shoulder" be changed to "occupational therapy" so she could get this submitted.

## 2021-07-03 NOTE — Telephone Encounter (Signed)
Nurses please do so thank you

## 2021-07-09 ENCOUNTER — Encounter: Payer: Self-pay | Admitting: Orthopedic Surgery

## 2021-07-09 ENCOUNTER — Other Ambulatory Visit: Payer: Self-pay

## 2021-07-09 ENCOUNTER — Ambulatory Visit: Payer: BC Managed Care – PPO | Admitting: Orthopedic Surgery

## 2021-07-09 ENCOUNTER — Ambulatory Visit: Payer: BC Managed Care – PPO

## 2021-07-09 VITALS — BP 153/84 | HR 105 | Ht 60.0 in | Wt 113.0 lb

## 2021-07-09 DIAGNOSIS — M7501 Adhesive capsulitis of right shoulder: Secondary | ICD-10-CM | POA: Diagnosis not present

## 2021-07-09 DIAGNOSIS — G8929 Other chronic pain: Secondary | ICD-10-CM

## 2021-07-09 DIAGNOSIS — M25511 Pain in right shoulder: Secondary | ICD-10-CM | POA: Diagnosis not present

## 2021-07-09 NOTE — Patient Instructions (Signed)

## 2021-07-09 NOTE — Progress Notes (Signed)
New Patient Visit  Assessment: Melinda Chapman is a 55 y.o. female with the following: Right shoulder adhesive capsulitis  Plan: Patient's pain and symptoms are most consistent with adhesive capsulitis.  She has severely restricted range of motion due to pain.  She is scheduled to start physical therapy next week.  We discussed the pathology, and anticipated improvements.  I recommended a steroid injection in clinic today, and she is elected to proceed.  We will plan for an ultrasound-guided, high-volume injection.  Follow-up as needed.  Procedure note injection - Right shoulder, ultrasound guidance   Verbal consent was obtained to inject the Right shoulder, glenohumeral joint  Timeout was completed to confirm the site of injection.   Using the ultrasound, the rotator cuff tendons were identified.  The joint space was also identified. The skin was prepped with alcohol and ethyl chloride was sprayed at the injection site.  A 21-gauge needle was used to inject 40 mg of Depo-Medrol and 1% lidocaine (4 cc) and 0.25% bupivacaine (5 cc) into the glenohumeral joint space of the Right shoulder using a posterolateral approach.  The needle was visualized entering the glenohumeral joint, and the medication was also visualized. There were no complications.  A sterile bandage was applied.   Note: In order to accurately identify the placement of the needle, ultrasound was required, to increase the accuracy, and specificity of the injection.    Follow-up: Return if symptoms worsen or fail to improve.  Subjective:  Chief Complaint  Patient presents with   Shoulder Pain    Rt shoulder pain for 4-5 months getting worse. Pain is running down to her elbow.     History of Present Illness: Melinda Chapman is a 55 y.o. RHD female who has been referred to clinic today by Sallee Lange, MD for evaluation of right shoulder pain.  Pain is been ongoing for the past 4 to 5 months.  She reports no specific  injury.  She has difficulty with range of motion.  She has tried ibuprofen, with minimal improvement in her symptoms.  No prior injection into her right shoulder.  She does not work with physical therapy.  Her PCP has placed a referral for therapy, and she is scheduled to begin next week.  Pain gets worse at night.  She has some radiating pains distally.   Review of Systems: No fevers or chills No numbness or tingling No chest pain No shortness of breath No bowel or bladder dysfunction No GI distress No headaches   Medical History:  Past Medical History:  Diagnosis Date   Anxiety    Aortic atherosclerosis ([place]) 04/21/2020   Seen on CAT scan for lung cancer screening August 2021   Constipation    Diabetes in pregnancy    Diarrhea    GERD (gastroesophageal reflux disease)    Hyperlipidemia 03/05/2020   Hypertension    IBS (irritable bowel syndrome)    Papanicolaou smear of cervix with positive high risk human papilloma virus (HPV) test 08/12/2017    Past Surgical History:  Procedure Laterality Date   [place], 2002   x 2   CHOLECYSTECTOMY  2000   in [place]  2002    Family History  Problem Relation Age of Onset   Colon cancer Paternal Grandmother    Hypertension Mother    Hyperlipidemia Mother    Colon polyps Father    Colon polyps  Brother    Colon polyps Paternal Aunt    Esophageal cancer Neg Hx    Stomach cancer Neg Hx    Social History   Tobacco Use   Smoking status: Every Day    Packs/day: 0.50    Years: 30.00    Pack years: 15.00    Types: Cigarettes   Smokeless tobacco: Never  Vaping Use   Vaping Use: Never used  Substance Use Topics   Alcohol use: Yes    Alcohol/week: 5.0 standard drinks    Types: 5 Cans of beer per week    Comment: occasional   Drug use: No    No Known Allergies  Current Meds  Medication Sig   esomeprazole (NEXIUM) 40 MG capsule TAKE (1)  CAPSULE BY MOUTH DAILY.   lisinopril (ZESTRIL) 20 MG tablet Take 1 tablet (20 mg total) by mouth daily.   LORazepam (ATIVAN) 0.5 MG tablet 1/2 to 1 bid prn   Multiple Vitamin (MULTIVITAMIN ADULT PO) Take by mouth.   rosuvastatin (CRESTOR) 5 MG tablet 1 q Monday and 1 qfriday   sertraline (ZOLOFT) 25 MG tablet 1/2 or 1 as directed daily    Objective: BP (!) 153/84   Pulse (!) 105   Ht 5' (1.524 m)   Wt 113 lb (51.3 kg)   BMI 22.07 kg/m   Physical Exam:  General: Alert and oriented. and No acute distress. Gait: Normal gait.   Evaluation of the right shoulder demonstrates no deformity.  No swelling is appreciated.  No bruising is appreciated.  Forward elevation limited to 90 degrees.  External rotation or side is limited to 10 degrees, compared to 45 degrees on contralateral side.  Fingers are warm and well-perfused.  2+ radial pulse.  Sensation is intact throughout the right hand   IMAGING: I personally ordered and reviewed the following images  X-ray of the right shoulder was obtained in clinic today and demonstrates no acute injuries.  Slight loss of glenohumeral joint space.  No evidence of proximal humeral migration.  No osteophytes are appreciated.  Impression: Normal right shoulder x-ray  New Medications:  No orders of the defined types were placed in this encounter.     Mordecai Rasmussen, MD  07/09/2021 12:51 PM

## 2021-07-11 ENCOUNTER — Ambulatory Visit (HOSPITAL_COMMUNITY): Payer: BC Managed Care – PPO

## 2021-07-16 ENCOUNTER — Ambulatory Visit (HOSPITAL_COMMUNITY): Payer: BC Managed Care – PPO

## 2021-07-22 ENCOUNTER — Ambulatory Visit (HOSPITAL_COMMUNITY): Payer: BC Managed Care – PPO | Attending: Family Medicine

## 2021-07-22 ENCOUNTER — Encounter (HOSPITAL_COMMUNITY): Payer: Self-pay

## 2021-07-22 ENCOUNTER — Other Ambulatory Visit: Payer: Self-pay

## 2021-07-22 DIAGNOSIS — R29898 Other symptoms and signs involving the musculoskeletal system: Secondary | ICD-10-CM | POA: Insufficient documentation

## 2021-07-22 DIAGNOSIS — M25611 Stiffness of right shoulder, not elsewhere classified: Secondary | ICD-10-CM | POA: Insufficient documentation

## 2021-07-22 DIAGNOSIS — G8929 Other chronic pain: Secondary | ICD-10-CM | POA: Diagnosis not present

## 2021-07-22 DIAGNOSIS — M25512 Pain in left shoulder: Secondary | ICD-10-CM | POA: Insufficient documentation

## 2021-07-22 DIAGNOSIS — M25511 Pain in right shoulder: Secondary | ICD-10-CM | POA: Diagnosis not present

## 2021-07-22 DIAGNOSIS — M25612 Stiffness of left shoulder, not elsewhere classified: Secondary | ICD-10-CM | POA: Diagnosis not present

## 2021-07-22 NOTE — Patient Instructions (Signed)
Use a heating pad as needed to help decrease muscle tightness. 10-15 minutes at a time.   Complete 1-2 times a day.  1) SHOULDER: Flexion On Table   Place hands on towel placed on table, elbows straight. Lean forward with you upper body, pushing towel away from body.  __10-12_ reps per set, ___ sets per day  2) Abduction (Passive)   With arm out to side, resting on towel placed on table with palm DOWN, keeping trunk away from table, lean to the side while pushing towel away from body.  Repeat __10-12__ times. Do ____ sessions per day.  Copyright  VHI. All rights reserved.     3) Internal Rotation (Assistive)   Seated with elbow bent at right angle and held against side, slide arm on table surface in an inward arc keeping elbow anchored in place. Repeat __10-12__ times. Do ____ sessions per day. Activity: Use this motion to brush crumbs off the table.  Copyright  VHI. All rights reserved.    1) Seated Row   Sit up straight with elbows by your sides. Pull back with shoulders/elbows, keeping forearms straight, as if pulling back on the reins of a horse. Squeeze shoulder blades together. Repeat _10-12 times, __1-2__sets/day      3) Shoulder Extension    Sit up straight with both arms by your side, draw your arms back behind your waist. Keep your elbows straight. Repeat __10-12_times, __1/2__sets/day.

## 2021-07-22 NOTE — Therapy (Signed)
Melinda Chapman, [place], [address] Phone: [phone]   Fax:  [phone]  Occupational Therapy Evaluation  Patient Details  Name: Melinda Chapman MRN: [id number] Date of Birth: 13-Mar-1966 Referring Provider (OT): Melinda Lange, MD (sees Dr. Amedeo Kinsman, MD also. Luking is PCP.)   Encounter Date: 07/22/2021   OT End of Session - 07/22/21 1726     Visit Number 1    Number of Visits 12    Date for OT Re-Evaluation 09/02/21    Authorization Type BCBS commercial PPO    Authorization Time Period copay $50, 30 visit limit    OT Start Time 1645    OT Stop Time 1721    OT Time Calculation (min) 36 min    Activity Tolerance Patient tolerated treatment well;Patient limited by pain    Behavior During Therapy [facility] for tasks assessed/performed             Past Medical History:  Diagnosis Date   Anxiety    Aortic atherosclerosis ([place]) 04/21/2020   Seen on CAT scan for lung cancer screening August 2021   Constipation    Diabetes in pregnancy    Diarrhea    GERD (gastroesophageal reflux disease)    Hyperlipidemia 03/05/2020   Hypertension    IBS (irritable bowel syndrome)    Papanicolaou smear of cervix with positive high risk human papilloma virus (HPV) test 08/12/2017    Past Surgical History:  Procedure Laterality Date   [place], 2002   x 2   CHOLECYSTECTOMY  2000   in [place]  2002    There were no vitals filed for this visit.   Subjective Assessment - 07/22/21 1650     Subjective  S: My Son broke his ankle in the summer and I had to pick up his knee scooter up and get it in and out of the car. I think that is what triggered it.    Pertinent History Pt is a 56 y/o female S/P right adhesive capsulitis which started this Summer, more than likely from lifting her son's knee scooter in and out of the car. Patient's PCP is Dr. Wolfgang Chapman who  referred patient to Dr. Amedeo Chapman and occupational therapy for evaluation and treatment. 07/09/21, patient received a shoulder injection which greatly decreased the pain and she was able to move it more. She is to follow up with him after finishing therapy if needed.    Patient Stated Goals to decrease pain and increase use of right arm.    Currently in Pain? Yes    Pain Score 1     Pain Location Shoulder    Pain Orientation Right    Pain Descriptors / Indicators Aching;Sore;Constant    Pain Type Chronic pain    Pain Onset More than a month ago    Pain Frequency Constant    Aggravating Factors  any movement may trigger    Pain Relieving Factors injection from Dr. Amedeo Chapman    Effect of Pain on Daily Activities severe effect    Multiple Pain Sites No               [facility] OT Assessment - 07/22/21 1652       Assessment   Medical Diagnosis right frozen shoulder    Referring Provider (OT) Melinda Lange, MD   sees Dr. Amedeo Kinsman, MD also. Luking is PCP.  Onset Date/Surgical Date --   Summer 2022   Hand Dominance Right    Next MD Visit TBD    Prior Therapy None for the shoulder      Precautions   Precautions None      Balance Screen   Has the patient fallen in the past 6 months No      Home  Environment   Family/patient expects to be discharged to: Private residence      Prior Function   Level of Independence Independent    Vocation Full time employment    Industrial/product designer - computer work      ADL   ADL comments Difficulty reaching higher than shoulder level, sleeping comfortable. fixing hair, washing, getting dressed, washing in the shower.      Mobility   Mobility Status Independent      Written Expression   Dominant Hand Right      Vision - History   Baseline Vision Wears glasses all the time      Cognition   Overall Cognitive Status Within Functional Limits for tasks assessed      Observation/Other Assessments   Focus on Therapeutic Outcomes  (FOTO)  44/100      Posture/Postural Control   Posture/Postural Control Postural limitations    Postural Limitations Rounded Shoulders;Forward head      ROM / Strength   AROM / PROM / Strength AROM;PROM;Strength      Palpation   Palpation comment max fascial restrictions palpated in the right upper arm, upper trapezius, and scapularis region.      AROM   Overall AROM Comments Assessed seated. IR/er adducted    AROM Assessment Site Shoulder    Right/Left Shoulder Right    Right Shoulder Flexion 93 Degrees    Right Shoulder ABduction 90 Degrees    Right Shoulder Internal Rotation 90 Degrees    Right Shoulder External Rotation 28 Degrees      PROM   Overall PROM Comments Assessed supine. IR/er adducted    PROM Assessment Site Shoulder    Right/Left Shoulder Right    Right Shoulder Flexion 111 Degrees    Right Shoulder ABduction 129 Degrees    Right Shoulder Internal Rotation 90 Degrees    Right Shoulder External Rotation 35 Degrees      Strength   Overall Strength Unable to assess;Due to pain                              OT Education - 07/22/21 1725     Education Details table slides and seated row and extension A/ROM    Person(s) Educated Patient    Methods Explanation;Demonstration;Verbal cues;Handout    Comprehension Returned demonstration;Verbalized understanding              OT Short Term Goals - 07/22/21 1730       OT SHORT TERM GOAL #1   Title Patient will be educated and independent with HEP in order to increase using her RUE as her dominant extremity for at least 25% or more of daily tasks.    Time 3    Period Weeks    Status New    Target Date 08/12/21      OT SHORT TERM GOAL #2   Title Patient will increase her RUE P/ROM to [facility] in order to increase ability to complete upper body dressing tasks with less difficulty.    Time 3  Period Weeks    Status New      OT SHORT TERM GOAL #3   Title Patient will decrease RUE fascial  restrictions to moderate amount in order to increase the functional mobility needed to complete waist level reaching tasks with less difficulty.    Time 3    Period Weeks    Status New               OT Long Term Goals - 07/22/21 1731       OT LONG TERM GOAL #1   Title Pt will return to using her RUE as her dominant extremity for 50% or more of daily and work related tasks.    Time 6    Period Weeks    Status New    Target Date 09/02/21      OT LONG TERM GOAL #2   Title Patient will report a pain level of approximately 4/10 or less when completing bathing and dressing tasks with her RUE.    Time 6    Period Weeks    Status New      OT LONG TERM GOAL #3   Title Pt will increase her RUE A/ROM to [facility] in order to complete work related tasks, dressing, and bathing tasks with minimal difficulty.    Time 6    Period Weeks    Status New      OT LONG TERM GOAL #4   Title Pt will decrease RUE fascial restrictions to minimal amount or less in order to increase functional mobility needed to complete reaching tasks at shoulder level or above.    Time 6    Period Weeks    Status New      OT LONG TERM GOAL #5   Title Patient will demonstrate RUE shoulder strength of 4/5 or greater while lifting weighted household items of moderate weight with no difficulty.    Time 6    Period Weeks    Status New                   Plan - 07/22/21 1727     Clinical Impression Statement A: Patient is a 55 y/o female S/P right shoulder adhesive capsulitis causing increased pain, fascial restrictions and decreased ROM and strength resulting in severe difficulty completing any daily or work related tasks with her RUE.    OT Occupational Profile and History Problem Focused Assessment - Including review of records relating to presenting problem    Occupational performance deficits (Please refer to evaluation for details): ADL's;IADL's;Work;Rest and Sleep    Body Structure / Function / Physical  Skills ADL;UE functional use;Fascial restriction;ROM;Strength    Rehab Potential Excellent    Clinical Decision Making Several treatment options, min-mod task modification necessary    Comorbidities Affecting Occupational Performance: May have comorbidities impacting occupational performance    Modification or Assistance to Complete Evaluation  No modification of tasks or assist necessary to complete eval    OT Frequency 2x / week    OT Duration 6 weeks    OT Treatment/Interventions Self-care/ADL training;Ultrasound;Patient/family education;DME and/or AE instruction;Passive range of motion;Cryotherapy;Electrical Stimulation;Moist Heat;Neuromuscular education;Therapeutic activities;Manual Therapy;Therapeutic exercise    Plan P: Patient will benefit from skilled OT services to increase functional use of her right UE and return to using it as her dominant extremity. Treatment Plan: Myofascial release, manual stretching, P/ROM, AA/ROM, A/ROM. [place] to decrease adhesions. Modalities PRN.    OT Home Exercise Plan eval: table slides, row and extension  scapular A/ROM             Patient will benefit from skilled therapeutic intervention in order to improve the following deficits and impairments:   Body Structure / Function / Physical Skills: ADL, UE functional use, Fascial restriction, ROM, Strength       Visit Diagnosis: Other symptoms and signs involving the musculoskeletal system - Plan: Ot plan of care cert/re-cert  Stiffness of left shoulder, not elsewhere classified - Plan: Ot plan of care cert/re-cert  Chronic left shoulder pain - Plan: Ot plan of care cert/re-cert    Problem List Patient Active Problem List   Diagnosis Date Noted   Stress 12/18/2020   Aortic atherosclerosis ([place]) 04/21/2020   Encounter for screening fecal occult blood testing 03/26/2020   Tobacco abuse 03/22/2020   Hyperlipidemia 03/05/2020   Mild sleep apnea 08/25/2017   Papanicolaou smear of cervix with  positive high risk human papilloma virus (HPV) test 08/12/2017   Encounter for gynecological examination with Papanicolaou smear of cervix 08/05/2017   Essential hypertension, benign 03/14/2013   Panic attack as reaction to stress 03/14/2013    Ailene Ravel, OTR/L,CBIS  [phone]  07/22/2021, 5:37 PM  [place], [place], [address] Phone: [phone]   Fax:  [phone]  Name: Melinda Chapman MRN: [id number] Date of Birth: [birth year]

## 2021-07-24 ENCOUNTER — Encounter (HOSPITAL_COMMUNITY): Payer: Self-pay | Admitting: Occupational Therapy

## 2021-07-24 ENCOUNTER — Ambulatory Visit (HOSPITAL_COMMUNITY): Payer: BC Managed Care – PPO | Admitting: Occupational Therapy

## 2021-07-24 ENCOUNTER — Other Ambulatory Visit: Payer: Self-pay

## 2021-07-24 DIAGNOSIS — M25512 Pain in left shoulder: Secondary | ICD-10-CM | POA: Diagnosis not present

## 2021-07-24 DIAGNOSIS — G8929 Other chronic pain: Secondary | ICD-10-CM | POA: Diagnosis not present

## 2021-07-24 DIAGNOSIS — M25612 Stiffness of left shoulder, not elsewhere classified: Secondary | ICD-10-CM | POA: Diagnosis not present

## 2021-07-24 DIAGNOSIS — M25511 Pain in right shoulder: Secondary | ICD-10-CM | POA: Diagnosis not present

## 2021-07-24 DIAGNOSIS — R29898 Other symptoms and signs involving the musculoskeletal system: Secondary | ICD-10-CM

## 2021-07-24 DIAGNOSIS — M25611 Stiffness of right shoulder, not elsewhere classified: Secondary | ICD-10-CM | POA: Diagnosis not present

## 2021-07-24 NOTE — Therapy (Signed)
[facility] [address] [place], [place], [address] Phone: [phone]   Fax:  [phone]  Occupational Therapy Treatment  Patient Details  Name: Melinda Chapman MRN: [id number] Date of Birth: December 12, 1965 Referring Provider (OT): Sallee Lange, MD (sees Dr. Amedeo Kinsman, MD also. Luking is PCP.)   Encounter Date: 07/24/2021   OT End of Session - 07/24/21 1511     Visit Number 2    Number of Visits 12    Date for OT Re-Evaluation 09/02/21    Authorization Type BCBS commercial PPO    Authorization Time Period copay $50, 30 visit limit    OT Start Time 6962    OT Stop Time 1510    OT Time Calculation (min) 38 min    Activity Tolerance Patient tolerated treatment well;Patient limited by pain    Behavior During Therapy [facility] for tasks assessed/performed             Past Medical History:  Diagnosis Date   Anxiety    Aortic atherosclerosis ([place]) 04/21/2020   Seen on CAT scan for lung cancer screening August 2021   Constipation    Diabetes in pregnancy    Diarrhea    GERD (gastroesophageal reflux disease)    Hyperlipidemia 03/05/2020   Hypertension    IBS (irritable bowel syndrome)    Papanicolaou smear of cervix with positive high risk human papilloma virus (HPV) test 08/12/2017    Past Surgical History:  Procedure Laterality Date   [place], 2002   x 2   CHOLECYSTECTOMY  2000   in [place]  2002    There were no vitals filed for this visit.   Subjective Assessment - 07/24/21 1431     Subjective  S: I've been doing those exercises.    Currently in Pain? Yes    Pain Score 4     Pain Location Shoulder    Pain Orientation Right    Pain Descriptors / Indicators Aching;Sharp    Pain Type Chronic pain    Pain Radiating Towards down to elbow    Pain Onset More than a month ago    Pain Frequency Constant    Aggravating Factors  certain movements     Pain Relieving Factors injection from Dr. Amedeo Kinsman    Effect of Pain on Daily Activities max effect on ADLs    Multiple Pain Sites No                [facility] OT Assessment - 07/24/21 1431       Assessment   Medical Diagnosis right frozen shoulder      Precautions   Precautions None                      OT Treatments/Exercises (OP) - 07/24/21 1434       Exercises   Exercises Shoulder      Shoulder Exercises: Supine   Protraction PROM;AAROM;10 reps    Horizontal ABduction PROM;AAROM;10 reps    External Rotation PROM;AAROM;10 reps    Internal Rotation PROM;AAROM;10 reps    Flexion PROM;AAROM;10 reps    ABduction PROM;AAROM;10 reps      Shoulder Exercises: Standing   Protraction AAROM;10 reps    Horizontal ABduction AAROM;10 reps    External Rotation AAROM;10 reps    Internal Rotation AAROM;10 reps    Flexion AAROM;10 reps    ABduction  AAROM;10 reps      Shoulder Exercises: Pulleys   Flexion 1 minute    ABduction 1 minute      Shoulder Exercises: ROM/Strengthening   Other ROM/Strengthening Exercises PVC pipe slide, 10X flexion      Manual Therapy   Manual Therapy Myofascial release    Manual therapy comments completed separately from therapeutic exercises    Myofascial Release Myofascial release and manual techniques to right upper arm, anterior shoulder, trapezius, and scapular regions to decrease pain and fascial restrictions and increase joint ROM                      OT Short Term Goals - 07/24/21 1512       OT SHORT TERM GOAL #1   Title Patient will be educated and independent with HEP in order to increase using her RUE as her dominant extremity for at least 25% or more of daily tasks.    Time 3    Period Weeks    Status On-going    Target Date 08/12/21      OT SHORT TERM GOAL #2   Title Patient will increase her RUE P/ROM to [facility] in order to increase ability to complete upper body dressing tasks with less difficulty.    Time  3    Period Weeks    Status On-going      OT SHORT TERM GOAL #3   Title Patient will decrease RUE fascial restrictions to moderate amount in order to increase the functional mobility needed to complete waist level reaching tasks with less difficulty.    Time 3    Period Weeks    Status On-going               OT Long Term Goals - 07/24/21 1513       OT LONG TERM GOAL #1   Title Pt will return to using her RUE as her dominant extremity for 50% or more of daily and work related tasks.    Time 6    Period Weeks    Status On-going    Target Date 09/02/21      OT LONG TERM GOAL #2   Title Patient will report a pain level of approximately 4/10 or less when completing bathing and dressing tasks with her RUE.    Time 6    Period Weeks    Status On-going      OT LONG TERM GOAL #3   Title Pt will increase her RUE A/ROM to [facility] in order to complete work related tasks, dressing, and bathing tasks with minimal difficulty.    Time 6    Period Weeks    Status On-going      OT LONG TERM GOAL #4   Title Pt will decrease RUE fascial restrictions to minimal amount or less in order to increase functional mobility needed to complete reaching tasks at shoulder level or above.    Time 6    Period Weeks    Status On-going      OT LONG TERM GOAL #5   Title Patient will demonstrate RUE shoulder strength of 4/5 or greater while lifting weighted household items of moderate weight with no difficulty.    Time 6    Period Weeks    Status On-going                   Plan - 07/24/21 1511     Clinical Impression Statement A: Pt reports  soreness since beginning her HEP. Initiated myofascial release, passive stretching, and AA/ROM in supine and standing. Pt also completing pvc pipe slide and pulleys. Achieving ROM approximately 65% today, verbal cuing for form and technique.    Body Structure / Function / Physical Skills ADL;UE functional use;Fascial restriction;ROM;Strength    Plan P:  Continue with AA/ROM and update for HEP, wall wash    OT Home Exercise Plan eval: table slides, row and extension scapular A/ROM    Consulted and Agree with Plan of Care Patient             Patient will benefit from skilled therapeutic intervention in order to improve the following deficits and impairments:   Body Structure / Function / Physical Skills: ADL, UE functional use, Fascial restriction, ROM, Strength       Visit Diagnosis: Other symptoms and signs involving the musculoskeletal system  Stiffness of left shoulder, not elsewhere classified  Chronic left shoulder pain    Problem List Patient Active Problem List   Diagnosis Date Noted   Stress 12/18/2020   Aortic atherosclerosis ([place]) 04/21/2020   Encounter for screening fecal occult blood testing 03/26/2020   Tobacco abuse 03/22/2020   Hyperlipidemia 03/05/2020   Mild sleep apnea 08/25/2017   Papanicolaou smear of cervix with positive high risk human papilloma virus (HPV) test 08/12/2017   Encounter for gynecological examination with Papanicolaou smear of cervix 08/05/2017   Essential hypertension, benign 03/14/2013   Panic attack as reaction to stress 03/14/2013    Guadelupe Sabin, OTR/L  [phone] 07/24/2021, 3:13 PM  [place], [place], [address] Phone: [phone]   Fax:  [phone]  Name: Melinda Chapman MRN: [id number] Date of Birth: [birth year]

## 2021-07-25 ENCOUNTER — Ambulatory Visit (HOSPITAL_COMMUNITY)
Admission: RE | Admit: 2021-07-25 | Discharge: 2021-07-25 | Disposition: A | Discharge status: Home / Self Care | Payer: BC Managed Care – PPO | Source: Ambulatory Visit | Attending: Physician Assistant | Admitting: Physician Assistant

## 2021-07-25 ENCOUNTER — Telehealth (HOSPITAL_COMMUNITY): Payer: Self-pay | Admitting: Occupational Therapy

## 2021-07-25 DIAGNOSIS — Z87891 Personal history of nicotine dependence: Secondary | ICD-10-CM | POA: Diagnosis not present

## 2021-07-25 DIAGNOSIS — Z122 Encounter for screening for malignant neoplasm of respiratory organs: Secondary | ICD-10-CM | POA: Insufficient documentation

## 2021-07-25 DIAGNOSIS — F17210 Nicotine dependence, cigarettes, uncomplicated: Secondary | ICD-10-CM | POA: Diagnosis not present

## 2021-07-25 NOTE — Telephone Encounter (Signed)
She will not be here Monday 07/28/2021 she has to work

## 2021-07-28 ENCOUNTER — Encounter (HOSPITAL_COMMUNITY): Payer: BC Managed Care – PPO | Admitting: Occupational Therapy

## 2021-07-30 ENCOUNTER — Other Ambulatory Visit: Payer: Self-pay

## 2021-07-30 ENCOUNTER — Ambulatory Visit (HOSPITAL_COMMUNITY): Payer: BC Managed Care – PPO

## 2021-07-30 DIAGNOSIS — R29898 Other symptoms and signs involving the musculoskeletal system: Secondary | ICD-10-CM

## 2021-07-30 DIAGNOSIS — M25512 Pain in left shoulder: Secondary | ICD-10-CM

## 2021-07-30 DIAGNOSIS — G8929 Other chronic pain: Secondary | ICD-10-CM

## 2021-07-30 DIAGNOSIS — M25511 Pain in right shoulder: Secondary | ICD-10-CM | POA: Diagnosis not present

## 2021-07-30 DIAGNOSIS — M25612 Stiffness of left shoulder, not elsewhere classified: Secondary | ICD-10-CM | POA: Diagnosis not present

## 2021-07-30 DIAGNOSIS — M25611 Stiffness of right shoulder, not elsewhere classified: Secondary | ICD-10-CM | POA: Diagnosis not present

## 2021-07-30 NOTE — Patient Instructions (Signed)
Perform each exercise ____10-15____ reps. 2-3x days.   1) Protraction   Start by holding a wand or cane at chest height.  Next, slowly push the wand outwards in front of your body so that your elbows become fully straightened. Then, return to the original position.     2) Shoulder FLEXION   In the standing position, hold a wand/cane with both arms, palms down on both sides. Raise up the wand/cane allowing your unaffected arm to perform most of the effort. Your affected arm should be partially relaxed.      3) Internal/External ROTATION   In the standing position, hold a wand/cane with both hands keeping your elbows bent. Move your arms and wand/cane to one side.  Your affected arm should be partially relaxed while your unaffected arm performs most of the effort.       4) Shoulder ABDUCTION   While holding a wand/cane palm face up on the injured side and palm face down on the uninjured side, slowly raise up your injured arm to the side.                

## 2021-08-01 ENCOUNTER — Encounter (HOSPITAL_COMMUNITY): Payer: Self-pay

## 2021-08-01 NOTE — Therapy (Signed)
[facility] [address] [place], [place], [address] Phone: [phone]   Fax:  [phone]  Occupational Therapy Treatment  Patient Details  Name: Melinda Chapman MRN: [id number] Date of Birth: 03/22/1966 Referring Provider (OT): Sallee Lange, MD (sees Dr. Amedeo Kinsman, MD also. [place] is PCP.)   Encounter Date: 07/30/2021   OT End of Session - 08/01/21 2115     Visit Number 3    Number of Visits 12    Date for OT Re-Evaluation 09/02/21    Authorization Type BCBS commercial PPO    Authorization Time Period copay $50, 30 visit limit    OT Start Time 5053    OT Stop Time 9767    OT Time Calculation (min) 38 min    Activity Tolerance Patient tolerated treatment well;Patient limited by pain    Behavior During Therapy [facility] for tasks assessed/performed             Past Medical History:  Diagnosis Date   Anxiety    Aortic atherosclerosis ([place]) 04/21/2020   Seen on CAT scan for lung cancer screening August 2021   Constipation    Diabetes in pregnancy    Diarrhea    GERD (gastroesophageal reflux disease)    Hyperlipidemia 03/05/2020   Hypertension    IBS (irritable bowel syndrome)    Papanicolaou smear of cervix with positive high risk human papilloma virus (HPV) test 08/12/2017    Past Surgical History:  Procedure Laterality Date   [place], 2002   x 2   CHOLECYSTECTOMY  2000   in [place]  2002    There were no vitals filed for this visit.   Subjective Assessment - 08/01/21 2103     Subjective  S: It's a little sore.    Currently in Pain? Yes    Pain Score 4     Pain Location Shoulder    Pain Orientation Right    Pain Descriptors / Indicators Aching    Pain Type Chronic pain    Pain Onset More than a month ago    Pain Frequency Constant    Aggravating Factors  certain movements    Effect of Pain on Daily Activities max effect on ADL  tasks.    Multiple Pain Sites No                [facility] OT Assessment - 08/01/21 2109       Assessment   Medical Diagnosis right frozen shoulder      Precautions   Precautions None                      OT Treatments/Exercises (OP) - 08/01/21 2110       Exercises   Exercises Shoulder      Shoulder Exercises: Supine   Protraction PROM;5 reps;AAROM;10 reps    Horizontal ABduction PROM;5 reps    External Rotation PROM;5 reps;AAROM;10 reps    Internal Rotation PROM;5 reps;AAROM;10 reps    Flexion PROM;5 reps;AAROM;10 reps    ABduction PROM;5 reps      Modalities   Modalities Ultrasound      Ultrasound   Ultrasound Location right anterior shoulder region with arm externally rotated    Ultrasound Parameters 1.5 W/cm2    Ultrasound Goals --   decreased adhesions     Manual Therapy   Manual Therapy Myofascial release  Manual therapy comments completed separately from therapeutic exercises    Myofascial Release Myofascial release and manual techniques to right upper arm, anterior shoulder, trapezius, and scapular regions to decrease pain and fascial restrictions and increase joint ROM                    OT Education - 08/01/21 2115     Education Details HEP AA/ROM flexion, protraction, IR/er    Person(s) Educated Patient    Methods Explanation;Demonstration;Verbal cues;Handout    Comprehension Returned demonstration;Verbalized understanding              OT Short Term Goals - 07/24/21 1512       OT SHORT TERM GOAL #1   Title Patient will be educated and independent with HEP in order to increase using her RUE as her dominant extremity for at least 25% or more of daily tasks.    Time 3    Period Weeks    Status On-going    Target Date 08/12/21      OT SHORT TERM GOAL #2   Title Patient will increase her RUE P/ROM to [facility] in order to increase ability to complete upper body dressing tasks with less difficulty.    Time 3    Period Weeks     Status On-going      OT SHORT TERM GOAL #3   Title Patient will decrease RUE fascial restrictions to moderate amount in order to increase the functional mobility needed to complete waist level reaching tasks with less difficulty.    Time 3    Period Weeks    Status On-going               OT Long Term Goals - 07/24/21 1513       OT LONG TERM GOAL #1   Title Pt will return to using her RUE as her dominant extremity for 50% or more of daily and work related tasks.    Time 6    Period Weeks    Status On-going    Target Date 09/02/21      OT LONG TERM GOAL #2   Title Patient will report a pain level of approximately 4/10 or less when completing bathing and dressing tasks with her RUE.    Time 6    Period Weeks    Status On-going      OT LONG TERM GOAL #3   Title Pt will increase her RUE A/ROM to [facility] in order to complete work related tasks, dressing, and bathing tasks with minimal difficulty.    Time 6    Period Weeks    Status On-going      OT LONG TERM GOAL #4   Title Pt will decrease RUE fascial restrictions to minimal amount or less in order to increase functional mobility needed to complete reaching tasks at shoulder level or above.    Time 6    Period Weeks    Status On-going      OT LONG TERM GOAL #5   Title Patient will demonstrate RUE shoulder strength of 4/5 or greater while lifting weighted household items of moderate weight with no difficulty.    Time 6    Period Weeks    Status On-going                   Plan - 08/01/21 2133     Clinical Impression Statement A: Pt expresses continued soreness/pain in right shoulder. [place] was used to  help decrease adhesions. Supine AA/ROM exercises were completed with time remaining. HEP was updated. VC for form and technique were provided.    Body Structure / Function / Physical Skills ADL;UE functional use;Fascial restriction;ROM;Strength    Plan P: Continue with [place] to decrease adhesions. Follow up on updated  HEP. Provided remainder AA/ROM if appropriate. Continue with supine AA/ROM. Wall wash.    OT Home Exercise Plan eval: table slides, row and extension scapular A/ROM 12/14: AA/ROM protraction, IR/er, flexion    Consulted and Agree with Plan of Care Patient             Patient will benefit from skilled therapeutic intervention in order to improve the following deficits and impairments:   Body Structure / Function / Physical Skills: ADL, UE functional use, Fascial restriction, ROM, Strength       Visit Diagnosis: Other symptoms and signs involving the musculoskeletal system  Stiffness of left shoulder, not elsewhere classified  Chronic left shoulder pain    Problem List Patient Active Problem List   Diagnosis Date Noted   Stress 12/18/2020   Aortic atherosclerosis ([place]) 04/21/2020   Encounter for screening fecal occult blood testing 03/26/2020   Tobacco abuse 03/22/2020   Hyperlipidemia 03/05/2020   Mild sleep apnea 08/25/2017   Papanicolaou smear of cervix with positive high risk human papilloma virus (HPV) test 08/12/2017   Encounter for gynecological examination with Papanicolaou smear of cervix 08/05/2017   Essential hypertension, benign 03/14/2013   Panic attack as reaction to stress 03/14/2013    Ailene Ravel, OTR/L,CBIS  [phone]  08/01/2021, 9:39 PM  [facility] [place], [place], [address] Phone: [phone]   Fax:  [phone]  Name: MELISA DONOFRIO MRN: [id number] Date of Birth: [birth year]

## 2021-08-04 ENCOUNTER — Ambulatory Visit (HOSPITAL_COMMUNITY): Payer: BC Managed Care – PPO | Admitting: Occupational Therapy

## 2021-08-04 ENCOUNTER — Encounter (HOSPITAL_COMMUNITY): Payer: Self-pay | Admitting: Occupational Therapy

## 2021-08-04 ENCOUNTER — Other Ambulatory Visit: Payer: Self-pay

## 2021-08-04 DIAGNOSIS — M25511 Pain in right shoulder: Secondary | ICD-10-CM | POA: Diagnosis not present

## 2021-08-04 DIAGNOSIS — M25612 Stiffness of left shoulder, not elsewhere classified: Secondary | ICD-10-CM | POA: Diagnosis not present

## 2021-08-04 DIAGNOSIS — G8929 Other chronic pain: Secondary | ICD-10-CM | POA: Diagnosis not present

## 2021-08-04 DIAGNOSIS — M25611 Stiffness of right shoulder, not elsewhere classified: Secondary | ICD-10-CM | POA: Diagnosis not present

## 2021-08-04 DIAGNOSIS — R29898 Other symptoms and signs involving the musculoskeletal system: Secondary | ICD-10-CM

## 2021-08-04 DIAGNOSIS — M25512 Pain in left shoulder: Secondary | ICD-10-CM | POA: Diagnosis not present

## 2021-08-04 NOTE — Therapy (Signed)
[facility] [address] [place], [place], [address] Phone: [phone]   Fax:  [phone]  Occupational Therapy Treatment  Patient Details  Name: Melinda Chapman MRN: [id number] Date of Birth: [birth year] Referring Provider (OT): Sallee Lange, MD (sees Dr. Amedeo Kinsman, MD also. [place] is PCP.)   Encounter Date: 08/04/2021   OT End of Session - 08/04/21 1723     Visit Number 4    Number of Visits 12    Date for OT Re-Evaluation 09/02/21    Authorization Type BCBS commercial PPO    Authorization Time Period copay $50, 30 visit limit    OT Start Time 4818    OT Stop Time 5631    OT Time Calculation (min) 40 min    Activity Tolerance Patient tolerated treatment well;Patient limited by pain    Behavior During Therapy [facility] for tasks assessed/performed             Past Medical History:  Diagnosis Date   Anxiety    Aortic atherosclerosis ([place]) 04/21/2020   Seen on CAT scan for lung cancer screening August 2021   Constipation    Diabetes in pregnancy    Diarrhea    GERD (gastroesophageal reflux disease)    Hyperlipidemia 03/05/2020   Hypertension    IBS (irritable bowel syndrome)    Papanicolaou smear of cervix with positive high risk human papilloma virus (HPV) test 08/12/2017    Past Surgical History:  Procedure Laterality Date   [place], 2002   x 2   CHOLECYSTECTOMY  2000   in [place]  2002    There were no vitals filed for this visit.   Subjective Assessment - 08/04/21 1648     Subjective  S: When I woke up this morning it felt totally normal.    Currently in Pain? Yes    Pain Score 2     Pain Location Shoulder    Pain Orientation Right    Pain Descriptors / Indicators Aching    Pain Type Chronic pain    Pain Radiating Towards down to elbow    Pain Onset More than a month ago    Pain Frequency Constant    Aggravating Factors   certain movements, exercises    Pain Relieving Factors injection    Effect of Pain on Daily Activities max effect on ADLs    Multiple Pain Sites No                [facility] OT Assessment - 08/04/21 1647       Assessment   Medical Diagnosis right frozen shoulder      Precautions   Precautions None                      OT Treatments/Exercises (OP) - 08/04/21 1649       Exercises   Exercises Shoulder      Shoulder Exercises: Supine   Protraction PROM;5 reps;AAROM;10 reps    Horizontal ABduction PROM;5 reps;Strengthening;10 reps    External Rotation PROM;5 reps;AAROM;10 reps    Internal Rotation PROM;5 reps;AAROM;10 reps    Flexion PROM;5 reps;AAROM;10 reps    ABduction PROM;5 reps;AAROM;10 reps      Shoulder Exercises: Standing   Protraction AAROM;10 reps    Horizontal ABduction AAROM;10 reps    External Rotation AAROM;10 reps    Internal Rotation AAROM;10 reps  Flexion AAROM;10 reps    ABduction AAROM;10 reps      Shoulder Exercises: Pulleys   Flexion 1 minute    ABduction 1 minute      Shoulder Exercises: ROM/Strengthening   UBE (Upper Arm Bike) Level 1 2' forward 2' reverse, pace: 3.5    Wall Wash 1'      Shoulder Exercises: Stretch   Internal Rotation Stretch 2 reps   10" holds, horizontal towel   Other Shoulder Stretches doorway stretch, 2x10"      Manual Therapy   Manual Therapy Myofascial release    Manual therapy comments completed separately from therapeutic exercises    Myofascial Release Myofascial release and manual techniques to right upper arm, anterior shoulder, trapezius, and scapular regions to decrease pain and fascial restrictions and increase joint ROM                      OT Short Term Goals - 07/24/21 1512       OT SHORT TERM GOAL #1   Title Patient will be educated and independent with HEP in order to increase using her RUE as her dominant extremity for at least 25% or more of daily tasks.    Time 3     Period Weeks    Status On-going    Target Date 08/12/21      OT SHORT TERM GOAL #2   Title Patient will increase her RUE P/ROM to [facility] in order to increase ability to complete upper body dressing tasks with less difficulty.    Time 3    Period Weeks    Status On-going      OT SHORT TERM GOAL #3   Title Patient will decrease RUE fascial restrictions to moderate amount in order to increase the functional mobility needed to complete waist level reaching tasks with less difficulty.    Time 3    Period Weeks    Status On-going               OT Long Term Goals - 07/24/21 1513       OT LONG TERM GOAL #1   Title Pt will return to using her RUE as her dominant extremity for 50% or more of daily and work related tasks.    Time 6    Period Weeks    Status On-going    Target Date 09/02/21      OT LONG TERM GOAL #2   Title Patient will report a pain level of approximately 4/10 or less when completing bathing and dressing tasks with her RUE.    Time 6    Period Weeks    Status On-going      OT LONG TERM GOAL #3   Title Pt will increase her RUE A/ROM to [facility] in order to complete work related tasks, dressing, and bathing tasks with minimal difficulty.    Time 6    Period Weeks    Status On-going      OT LONG TERM GOAL #4   Title Pt will decrease RUE fascial restrictions to minimal amount or less in order to increase functional mobility needed to complete reaching tasks at shoulder level or above.    Time 6    Period Weeks    Status On-going      OT LONG TERM GOAL #5   Title Patient will demonstrate RUE shoulder strength of 4/5 or greater while lifting weighted household items of moderate weight with no difficulty.  Time 6    Period Weeks    Status On-going                   Plan - 08/04/21 1723     Clinical Impression Statement A: Pt reports she felt good on waking up this morning, exercises caused soreness. Pt reports no change after [place] therefore did not  complete today. Continued with myofascial release to address fascial restrictions and muscle knots. Passive stretching completed, AA/ROM supine and standing. Added wall wash, IR and doorway stretches, and UBE today. Verbal cuing for form and technique.    Body Structure / Function / Physical Skills ADL;UE functional use;Fascial restriction;ROM;Strength    Plan P: Continue with shoulder stretches, adding additional stretches as appropriate. Resume pvc pipe slide    OT Home Exercise Plan eval: table slides, row and extension scapular A/ROM 12/14: AA/ROM protraction, IR/er, flexion    Consulted and Agree with Plan of Care Patient             Patient will benefit from skilled therapeutic intervention in order to improve the following deficits and impairments:   Body Structure / Function / Physical Skills: ADL, UE functional use, Fascial restriction, ROM, Strength       Visit Diagnosis: Other symptoms and signs involving the musculoskeletal system  Stiffness of left shoulder, not elsewhere classified    Problem List Patient Active Problem List   Diagnosis Date Noted   Stress 12/18/2020   Aortic atherosclerosis ([place]) 04/21/2020   Encounter for screening fecal occult blood testing 03/26/2020   Tobacco abuse 03/22/2020   Hyperlipidemia 03/05/2020   Mild sleep apnea 08/25/2017   Papanicolaou smear of cervix with positive high risk human papilloma virus (HPV) test 08/12/2017   Encounter for gynecological examination with Papanicolaou smear of cervix 08/05/2017   Essential hypertension, benign 03/14/2013   Panic attack as reaction to stress 03/14/2013    Guadelupe Sabin, OTR/L  [phone] 08/04/2021, 5:28 PM  [facility] [place], [place], [address] Phone: [phone]   Fax:  [phone]  Name: Melinda Chapman MRN: [id number] Date of Birth: March 21, 1966

## 2021-08-06 ENCOUNTER — Ambulatory Visit (HOSPITAL_COMMUNITY): Payer: BC Managed Care – PPO

## 2021-08-06 ENCOUNTER — Encounter (HOSPITAL_COMMUNITY): Payer: Self-pay

## 2021-08-06 ENCOUNTER — Other Ambulatory Visit: Payer: Self-pay

## 2021-08-06 DIAGNOSIS — M25512 Pain in left shoulder: Secondary | ICD-10-CM | POA: Diagnosis not present

## 2021-08-06 DIAGNOSIS — R29898 Other symptoms and signs involving the musculoskeletal system: Secondary | ICD-10-CM

## 2021-08-06 DIAGNOSIS — M25511 Pain in right shoulder: Secondary | ICD-10-CM | POA: Diagnosis not present

## 2021-08-06 DIAGNOSIS — M25612 Stiffness of left shoulder, not elsewhere classified: Secondary | ICD-10-CM

## 2021-08-06 DIAGNOSIS — M25611 Stiffness of right shoulder, not elsewhere classified: Secondary | ICD-10-CM | POA: Diagnosis not present

## 2021-08-06 DIAGNOSIS — G8929 Other chronic pain: Secondary | ICD-10-CM | POA: Diagnosis not present

## 2021-08-06 NOTE — Patient Instructions (Signed)
Complete the following exercises 2-3 times a day.  Doorway Stretch  Place each hand opposite each other on the doorway. (You can change where you feel the stretch by moving arms higher or lower.) Step through with one foot and bend front knee until a stretch is felt and hold. Step through with the opposite foot on the next rep. Hold for __20___ seconds. Repeat __2__times.       Internal Rotation Across Back  Grab the end of a towel with your affected side, palm facing backwards. Grab the towel with your unaffected side and pull your affected hand across your back until you feel a stretch in the front of your shoulder. If you feel pain, pull just to the pain, do not pull through the pain. Hold. Return your affected arm to your side. Try to keep your hand/arm close to your body during the entire movement.     Hold for 10 seconds. Complete 2 times.        Posterior Capsule Stretch   Stand or sit, one arm across body so hand rests over opposite shoulder. Gently push on crossed elbow with other hand until stretch is felt in shoulder of crossed arm. Hold _10__ seconds.  Repeat _2__ times per session.   Wall Flexion  Slide your arm up the wall or door frame until a stretch is felt in your shoulder . Hold for 10 seconds. Complete 2 times     WALL EXTERNAL ROTATION STRETCH - ER  Start by standing in a doorway or at the corner of a wall and place your hand on the wall with your elbow bent as shown. Next, gently turn your body the opposite direction causing your shoulder to externally rotate until a stretch is felt. Hold for 10 seconds. Complete 2 times.

## 2021-08-07 NOTE — Therapy (Signed)
[facility] [address] [place], [place], [address] Phone: [phone]   Fax:  [phone]  Occupational Therapy Treatment  Patient Details  Name: Melinda Chapman MRN: [id number] Date of Birth: 1965/10/06 Referring Provider (OT): Sallee Lange, MD (sees Dr. Amedeo Kinsman, MD also. Luk[place] is PCP.)   Encounter Date: 08/06/2021   OT End of Session - 08/07/21 0834     Visit Number 5    Number of Visits 12    Date for OT Re-Evaluation 09/02/21    Authorization Type BCBS commercial PPO    Authorization Time Period copay $50, 30 visit limit    OT Start Time 1600    OT Stop Time 1638    OT Time Calculation (min) 38 min    Activity Tolerance Patient tolerated treatment well;Patient limited by pain    Behavior During Therapy [facility] for tasks assessed/performed             Past Medical History:  Diagnosis Date   Anxiety    Aortic atherosclerosis ([place]) 04/21/2020   Seen on CAT scan for lung cancer screening August 2021   Constipation    Diabetes in pregnancy    Diarrhea    GERD (gastroesophageal reflux disease)    Hyperlipidemia 03/05/2020   Hypertension    IBS (irritable bowel syndrome)    Papanicolaou smear of cervix with positive high risk human papilloma virus (HPV) test 08/12/2017    Past Surgical History:  Procedure Laterality Date   [place], 2002   x 2   CHOLECYSTECTOMY  2000   in [place]  2002    There were no vitals filed for this visit.   Subjective Assessment - 08/06/21 1636     Subjective  S: It's still sore; especially with certain movements.    Currently in Pain? Yes    Pain Score 2     Pain Location Shoulder    Pain Orientation Right    Pain Descriptors / Indicators Aching    Pain Type Chronic pain    Pain Onset More than a month ago    Pain Frequency Constant    Aggravating Factors  certain movements    Pain Relieving Factors  heating pad    Effect of Pain on Daily Activities max effect    Multiple Pain Sites No                [facility] OT Assessment - 08/06/21 1638       Assessment   Medical Diagnosis right frozen shoulder      Precautions   Precautions None                      OT Treatments/Exercises (OP) - 08/06/21 1623       Exercises   Exercises Shoulder      Shoulder Exercises: Supine   Protraction PROM;5 reps    Horizontal ABduction PROM;5 reps    External Rotation PROM;5 reps    Internal Rotation PROM;5 reps    Flexion PROM;5 reps    ABduction PROM;5 reps      Shoulder Exercises: ROM/Strengthening   Wall Wash 1'      Shoulder Exercises: Stretch   Cross Chest Stretch 2 reps;10 seconds    Internal Rotation Stretch 2 reps   10" hold horizontal towel.   External Rotation Stretch 2 reps;10 seconds   using wall,  shoulder adducted   Wall Stretch - Flexion 2 reps;10 seconds    Table Stretch - External Rotation 2 reps;10 seconds   no stretch felt   Star Gazer Stretch 2 reps;20 seconds    Other Shoulder Stretches doorway stretch, 2x10"      Modalities   Modalities Ultrasound      Ultrasound   Ultrasound Location right anterior shoulder    Ultrasound Parameters 1.5 W/cm2    Ultrasound Goals Other (Comment)   deep heat prior to passive stretching     Manual Therapy   Manual Therapy --    Manual therapy comments completed separately from therapeutic exercises    Myofascial Release --                    OT Education - 08/06/21 1635     Education Details shoulder stretches    Person(s) Educated Patient    Methods Explanation;Demonstration;Verbal cues;Handout;Tactile cues    Comprehension Returned demonstration;Verbalized understanding              OT Short Term Goals - 07/24/21 1512       OT SHORT TERM GOAL #1   Title Patient will be educated and independent with HEP in order to increase using her RUE as her dominant extremity for at least 25% or  more of daily tasks.    Time 3    Period Weeks    Status On-going    Target Date 08/12/21      OT SHORT TERM GOAL #2   Title Patient will increase her RUE P/ROM to [facility] in order to increase ability to complete upper body dressing tasks with less difficulty.    Time 3    Period Weeks    Status On-going      OT SHORT TERM GOAL #3   Title Patient will decrease RUE fascial restrictions to moderate amount in order to increase the functional mobility needed to complete waist level reaching tasks with less difficulty.    Time 3    Period Weeks    Status On-going               OT Long Term Goals - 07/24/21 1513       OT LONG TERM GOAL #1   Title Pt will return to using her RUE as her dominant extremity for 50% or more of daily and work related tasks.    Time 6    Period Weeks    Status On-going    Target Date 09/02/21      OT LONG TERM GOAL #2   Title Patient will report a pain level of approximately 4/10 or less when completing bathing and dressing tasks with her RUE.    Time 6    Period Weeks    Status On-going      OT LONG TERM GOAL #3   Title Pt will increase her RUE A/ROM to [facility] in order to complete work related tasks, dressing, and bathing tasks with minimal difficulty.    Time 6    Period Weeks    Status On-going      OT LONG TERM GOAL #4   Title Pt will decrease RUE fascial restrictions to minimal amount or less in order to increase functional mobility needed to complete reaching tasks at shoulder level or above.    Time 6    Period Weeks    Status On-going      OT LONG TERM GOAL #5   Title  Patient will demonstrate RUE shoulder strength of 4/5 or greater while lifting weighted household items of moderate weight with no difficulty.    Time 6    Period Weeks    Status On-going                   Plan - 08/07/21 6045     Clinical Impression Statement A: Started session with [place] to right anterior glenohumeral joint to deliver deep heat to the shoulder  capsular structure. Passive stretching completed to shoulder in all ranges. Provided cueing to decrease muscle guarding and allow slightly more stretch when able to tolerate. Focused on completing shoulder stretches that were provided to HEP. VC for form and technique were provided.    Body Structure / Function / Physical Skills ADL;UE functional use;Fascial restriction;ROM;Strength    Plan P: Continue with [place] at start of session prior to passive stretching. Follow up on shoulder stretches HEP. Resume PVC pipe slide.    OT Home Exercise Plan eval: table slides, row and extension scapular A/ROM 12/14: AA/ROM protraction, IR/er, flexion 12/21: shoulder stretches    Consulted and Agree with Plan of Care Patient             Patient will benefit from skilled therapeutic intervention in order to improve the following deficits and impairments:   Body Structure / Function / Physical Skills: ADL, UE functional use, Fascial restriction, ROM, Strength       Visit Diagnosis: Other symptoms and signs involving the musculoskeletal system  Stiffness of left shoulder, not elsewhere classified  Chronic left shoulder pain    Problem List Patient Active Problem List   Diagnosis Date Noted   Stress 12/18/2020   Aortic atherosclerosis ([place]) 04/21/2020   Encounter for screening fecal occult blood testing 03/26/2020   Tobacco abuse 03/22/2020   Hyperlipidemia 03/05/2020   Mild sleep apnea 08/25/2017   Papanicolaou smear of cervix with positive high risk human papilloma virus (HPV) test 08/12/2017   Encounter for gynecological examination with Papanicolaou smear of cervix 08/05/2017   Essential hypertension, benign 03/14/2013   Panic attack as reaction to stress 03/14/2013    Ailene Ravel, OTR/L,CBIS  [phone]  08/07/2021, 8:40 AM  [facility] [place], [place], [address] Phone: [phone]   Fax:  [phone]  Name: Melinda Chapman MRN: [id number] Date of Birth: [birth year]

## 2021-08-13 ENCOUNTER — Other Ambulatory Visit: Payer: Self-pay

## 2021-08-13 ENCOUNTER — Ambulatory Visit (HOSPITAL_COMMUNITY): Payer: BC Managed Care – PPO | Admitting: Occupational Therapy

## 2021-08-13 ENCOUNTER — Encounter (HOSPITAL_COMMUNITY): Payer: Self-pay | Admitting: Occupational Therapy

## 2021-08-13 DIAGNOSIS — G8929 Other chronic pain: Secondary | ICD-10-CM

## 2021-08-13 DIAGNOSIS — R29898 Other symptoms and signs involving the musculoskeletal system: Secondary | ICD-10-CM | POA: Diagnosis not present

## 2021-08-13 DIAGNOSIS — M25511 Pain in right shoulder: Secondary | ICD-10-CM | POA: Diagnosis not present

## 2021-08-13 DIAGNOSIS — M25611 Stiffness of right shoulder, not elsewhere classified: Secondary | ICD-10-CM

## 2021-08-13 DIAGNOSIS — M25612 Stiffness of left shoulder, not elsewhere classified: Secondary | ICD-10-CM | POA: Diagnosis not present

## 2021-08-13 DIAGNOSIS — M25512 Pain in left shoulder: Secondary | ICD-10-CM | POA: Diagnosis not present

## 2021-08-13 NOTE — Therapy (Signed)
[facility] [address] [place], [place], [address] Phone: [phone]   Fax:  [phone]  Occupational Therapy Treatment  Patient Details  Name: Melinda Chapman MRN: [id number] Date of Birth: June 09, 1966 Referring Provider (OT): Sallee Lange, MD (sees Dr. Amedeo Kinsman, MD also. Luk[place] is PCP.)   Encounter Date: 08/13/2021   OT End of Session - 08/13/21 1736     Visit Number 6    Number of Visits 12    Date for OT Re-Evaluation 09/02/21    Authorization Type BCBS commercial PPO    Authorization Time Period copay $50, 30 visit limit    OT Start Time 1648    OT Stop Time 1726    OT Time Calculation (min) 38 min    Activity Tolerance Patient tolerated treatment well;Patient limited by pain    Behavior During Therapy [facility] for tasks assessed/performed             Past Medical History:  Diagnosis Date   Anxiety    Aortic atherosclerosis ([place]) 04/21/2020   Seen on CAT scan for lung cancer screening August 2021   Constipation    Diabetes in pregnancy    Diarrhea    GERD (gastroesophageal reflux disease)    Hyperlipidemia 03/05/2020   Hypertension    IBS (irritable bowel syndrome)    Papanicolaou smear of cervix with positive high risk human papilloma virus (HPV) test 08/12/2017    Past Surgical History:  Procedure Laterality Date   [place], 2002   x 2   CHOLECYSTECTOMY  2000   in [place]  2002    There were no vitals filed for this visit.   Subjective Assessment - 08/13/21 1647     Subjective  S: It's giving me trouble in the mornings.    Currently in Pain? No/denies                [facility] OT Assessment - 08/13/21 1645       Assessment   Medical Diagnosis right frozen shoulder      Precautions   Precautions None                      OT Treatments/Exercises (OP) - 08/13/21 1649       Exercises   Exercises  Shoulder      Shoulder Exercises: Supine   Protraction PROM;5 reps;AAROM;10 reps    Horizontal ABduction PROM;5 reps;AAROM;10 reps    External Rotation PROM;5 reps;AAROM;10 reps    Internal Rotation PROM;5 reps;AAROM;10 reps    Flexion PROM;5 reps;AAROM;10 reps    ABduction PROM;5 reps;AAROM;10 reps      Shoulder Exercises: Standing   Protraction AAROM;10 reps    Horizontal ABduction AAROM;10 reps    External Rotation AAROM;10 reps    Internal Rotation AAROM;10 reps    Flexion AAROM;10 reps    ABduction AAROM;10 reps    Extension Theraband;10 reps    Theraband Level (Shoulder Extension) Level 2 (Red)    Row Theraband;10 reps    Theraband Level (Shoulder Row) Level 2 (Red)      Shoulder Exercises: Pulleys   Flexion 1 minute    ABduction 1 minute      Shoulder Exercises: ROM/Strengthening   Other ROM/Strengthening Exercises PVC pipe slide, 10X flexion      Shoulder Exercises: Stretch   Cross Chest Stretch 2 reps;10 seconds  Internal Rotation Stretch 2 reps   10" hold horizontal towel   External Rotation Stretch 2 reps;10 seconds    Wall Stretch - Flexion 2 reps;10 seconds    Wall Stretch - ABduction 2 reps;10 seconds    Other Shoulder Stretches doorway stretch, 2x10"      Manual Therapy   Manual Therapy Myofascial release    Manual therapy comments completed separately from therapeutic exercises    Myofascial Release Myofascial release and manual techniques to right upper arm, anterior shoulder, trapezius, and scapular regions to decrease pain and fascial restrictions and increase joint ROM                      OT Short Term Goals - 07/24/21 1512       OT SHORT TERM GOAL #1   Title Patient will be educated and independent with HEP in order to increase using her RUE as her dominant extremity for at least 25% or more of daily tasks.    Time 3    Period Weeks    Status On-going    Target Date 08/12/21      OT SHORT TERM GOAL #2   Title Patient will  increase her RUE P/ROM to [facility] in order to increase ability to complete upper body dressing tasks with less difficulty.    Time 3    Period Weeks    Status On-going      OT SHORT TERM GOAL #3   Title Patient will decrease RUE fascial restrictions to moderate amount in order to increase the functional mobility needed to complete waist level reaching tasks with less difficulty.    Time 3    Period Weeks    Status On-going               OT Long Term Goals - 07/24/21 1513       OT LONG TERM GOAL #1   Title Pt will return to using her RUE as her dominant extremity for 50% or more of daily and work related tasks.    Time 6    Period Weeks    Status On-going    Target Date 09/02/21      OT LONG TERM GOAL #2   Title Patient will report a pain level of approximately 4/10 or less when completing bathing and dressing tasks with her RUE.    Time 6    Period Weeks    Status On-going      OT LONG TERM GOAL #3   Title Pt will increase her RUE A/ROM to [facility] in order to complete work related tasks, dressing, and bathing tasks with minimal difficulty.    Time 6    Period Weeks    Status On-going      OT LONG TERM GOAL #4   Title Pt will decrease RUE fascial restrictions to minimal amount or less in order to increase functional mobility needed to complete reaching tasks at shoulder level or above.    Time 6    Period Weeks    Status On-going      OT LONG TERM GOAL #5   Title Patient will demonstrate RUE shoulder strength of 4/5 or greater while lifting weighted household items of moderate weight with no difficulty.    Time 6    Period Weeks    Status On-going                   Plan - 08/13/21 1736  Clinical Impression Statement A: Continued with myofascial release to right upper arm and trapezius regions to address fascial restrictions, continued with passive stretching. ER continues to be the most limited and painful. Cuing for relaxing arm and allowing for stretching.  Continued with AA/ROM and shoulder stretches, added scapular theraband row and extension. Verbal cuing for form and technique.    Body Structure / Function / Physical Skills ADL;UE functional use;Fascial restriction;ROM;Strength    Plan P: Continue working to increase pt's available ROM, [place] if able at beginning of session    OT Home Exercise Plan eval: table slides, row and extension scapular A/ROM 12/14: AA/ROM protraction, IR/er, flexion 12/21: shoulder stretches    Consulted and Agree with Plan of Care Patient             Patient will benefit from skilled therapeutic intervention in order to improve the following deficits and impairments:   Body Structure / Function / Physical Skills: ADL, UE functional use, Fascial restriction, ROM, Strength       Visit Diagnosis: Other symptoms and signs involving the musculoskeletal system  Chronic right shoulder pain  Stiffness of right shoulder, not elsewhere classified    Problem List Patient Active Problem List   Diagnosis Date Noted   Stress 12/18/2020   Aortic atherosclerosis ([place]) 04/21/2020   Encounter for screening fecal occult blood testing 03/26/2020   Tobacco abuse 03/22/2020   Hyperlipidemia 03/05/2020   Mild sleep apnea 08/25/2017   Papanicolaou smear of cervix with positive high risk human papilloma virus (HPV) test 08/12/2017   Encounter for gynecological examination with Papanicolaou smear of cervix 08/05/2017   Essential hypertension, benign 03/14/2013   Panic attack as reaction to stress 03/14/2013    Guadelupe Sabin, OTR/L  [phone] 08/13/2021, 5:38 PM  [facility] [place], [place], [address] Phone: [phone]   Fax:  [phone]  Name: Melinda Chapman MRN: [id number] Date of Birth: 09/24/1965

## 2021-08-14 ENCOUNTER — Ambulatory Visit (HOSPITAL_COMMUNITY): Payer: BC Managed Care – PPO | Admitting: Occupational Therapy

## 2021-08-14 ENCOUNTER — Encounter (HOSPITAL_COMMUNITY): Payer: Self-pay | Admitting: Occupational Therapy

## 2021-08-14 DIAGNOSIS — M25511 Pain in right shoulder: Secondary | ICD-10-CM | POA: Diagnosis not present

## 2021-08-14 DIAGNOSIS — M25512 Pain in left shoulder: Secondary | ICD-10-CM | POA: Diagnosis not present

## 2021-08-14 DIAGNOSIS — M25611 Stiffness of right shoulder, not elsewhere classified: Secondary | ICD-10-CM | POA: Diagnosis not present

## 2021-08-14 DIAGNOSIS — G8929 Other chronic pain: Secondary | ICD-10-CM | POA: Diagnosis not present

## 2021-08-14 DIAGNOSIS — R29898 Other symptoms and signs involving the musculoskeletal system: Secondary | ICD-10-CM | POA: Diagnosis not present

## 2021-08-14 DIAGNOSIS — M25612 Stiffness of left shoulder, not elsewhere classified: Secondary | ICD-10-CM | POA: Diagnosis not present

## 2021-08-14 NOTE — Patient Instructions (Signed)
Perform each exercise ____10-15____ reps. 2-3x days.    1) Shoulder FLEXION   In the standing position, hold a wand/cane with both arms, palms down on both sides. Raise up the wand/cane allowing your unaffected arm to perform most of the effort. Your affected arm should be partially relaxed.      2) Internal/External ROTATION   In the standing position, hold a wand/cane with both hands keeping your elbows bent. Move your arms and wand/cane to one side.  Your affected arm should be partially relaxed while your unaffected arm performs most of the effort.        3) Shoulder Protraction    Begin with elbows by your side, slowly "punch" straight out in front of you.      4) Horizontal abduction/adduction  Standing:           Begin with arms straight out in front of you, bring out to the side in at "T" shape. Keep arms straight entire time.      5) Shoulder Abduction  Standing:       Lying on your back begin with your arms flat on the table next to your side. Slowly move your arms out to the side so that they go overhead, in a jumping jack or snow angel movement.

## 2021-08-14 NOTE — Therapy (Signed)
[facility] [address] [place], [place], [address] Phone: [phone]   Fax:  [phone]  Occupational Therapy Treatment  Patient Details  Name: Melinda Chapman MRN: [id number] Date of Birth: 1965-10-05 Referring Provider (OT): Sallee Lange, MD (sees Dr. Amedeo Kinsman, MD also. Luk[place] is PCP.)   Encounter Date: 08/14/2021   OT End of Session - 08/14/21 1555     Visit Number 7    Number of Visits 12    Date for OT Re-Evaluation 09/02/21    Authorization Type BCBS commercial PPO    Authorization Time Period copay $50, 30 visit limit    OT Start Time 1515    OT Stop Time 1554    OT Time Calculation (min) 39 min    Activity Tolerance Patient tolerated treatment well;Patient limited by pain    Behavior During Therapy [facility] for tasks assessed/performed             Past Medical History:  Diagnosis Date   Anxiety    Aortic atherosclerosis ([place]) 04/21/2020   Seen on CAT scan for lung cancer screening August 2021   Constipation    Diabetes in pregnancy    Diarrhea    GERD (gastroesophageal reflux disease)    Hyperlipidemia 03/05/2020   Hypertension    IBS (irritable bowel syndrome)    Papanicolaou smear of cervix with positive high risk human papilloma virus (HPV) test 08/12/2017    Past Surgical History:  Procedure Laterality Date   [place], 2002   x 2   CHOLECYSTECTOMY  2000   in [place]  2002    There were no vitals filed for this visit.   Subjective Assessment - 08/14/21 1517     Subjective  S: It popped again this morning.    Currently in Pain? No/denies                [facility] OT Assessment - 08/14/21 1517       Assessment   Medical Diagnosis right frozen shoulder      Precautions   Precautions None                      OT Treatments/Exercises (OP) - 08/14/21 1517       Exercises   Exercises Shoulder       Shoulder Exercises: Supine   Protraction PROM;5 reps;AROM;10 reps    Horizontal ABduction PROM;5 reps;AROM;10 reps    External Rotation PROM;5 reps;AAROM;10 reps    Internal Rotation PROM;5 reps;AAROM;10 reps    Flexion PROM;5 reps;AAROM;10 reps   2-3" hold at end range   ABduction PROM;5 reps;AROM;10 reps      Shoulder Exercises: Standing   Protraction AROM;10 reps    Horizontal ABduction AROM;10 reps    External Rotation AAROM;10 reps    Internal Rotation AAROM;10 reps    Flexion AAROM;10 reps    ABduction AROM;10 reps    Extension Theraband;10 reps    Theraband Level (Shoulder Extension) Level 2 (Red)    Row Theraband;10 reps    Theraband Level (Shoulder Row) Level 2 (Red)      Shoulder Exercises: Pulleys   Flexion 1 minute    ABduction 1 minute      Shoulder Exercises: ROM/Strengthening   Proximal Shoulder Strengthening, Supine 10X each, no rest breaks      Shoulder Exercises: Stretch   Cross Chest Stretch  2 reps   15" holds   Internal Rotation Stretch 2 reps   15" holds, horizontal towel   External Rotation Stretch 2 reps   15" holds   Wall Stretch - Flexion 2 reps   15"   Wall Stretch - ABduction 2 reps   15" holds   Other Shoulder Stretches doorway stretch, 2x15"      Manual Therapy   Manual Therapy Myofascial release    Manual therapy comments completed separately from therapeutic exercises    Myofascial Release Myofascial release and manual techniques to right upper arm, anterior shoulder, trapezius, and scapular regions to decrease pain and fascial restrictions and increase joint ROM                    OT Education - 08/14/21 1547     Education Details AA/ROM er and flexion, A/ROM protraction, horizontal abduction, and abduction    Person(s) Educated Patient    Methods Explanation;Demonstration;Verbal cues;Handout    Comprehension Returned demonstration;Verbalized understanding              OT Short Term Goals - 07/24/21 1512       OT  SHORT TERM GOAL #1   Title Patient will be educated and independent with HEP in order to increase using her RUE as her dominant extremity for at least 25% or more of daily tasks.    Time 3    Period Weeks    Status On-going    Target Date 08/12/21      OT SHORT TERM GOAL #2   Title Patient will increase her RUE P/ROM to [facility] in order to increase ability to complete upper body dressing tasks with less difficulty.    Time 3    Period Weeks    Status On-going      OT SHORT TERM GOAL #3   Title Patient will decrease RUE fascial restrictions to moderate amount in order to increase the functional mobility needed to complete waist level reaching tasks with less difficulty.    Time 3    Period Weeks    Status On-going               OT Long Term Goals - 07/24/21 1513       OT LONG TERM GOAL #1   Title Pt will return to using her RUE as her dominant extremity for 50% or more of daily and work related tasks.    Time 6    Period Weeks    Status On-going    Target Date 09/02/21      OT LONG TERM GOAL #2   Title Patient will report a pain level of approximately 4/10 or less when completing bathing and dressing tasks with her RUE.    Time 6    Period Weeks    Status On-going      OT LONG TERM GOAL #3   Title Pt will increase her RUE A/ROM to [facility] in order to complete work related tasks, dressing, and bathing tasks with minimal difficulty.    Time 6    Period Weeks    Status On-going      OT LONG TERM GOAL #4   Title Pt will decrease RUE fascial restrictions to minimal amount or less in order to increase functional mobility needed to complete reaching tasks at shoulder level or above.    Time 6    Period Weeks    Status On-going      OT LONG TERM  GOAL #5   Title Patient will demonstrate RUE shoulder strength of 4/5 or greater while lifting weighted household items of moderate weight with no difficulty.    Time 6    Period Weeks    Status On-going                    Plan - 08/14/21 1536     Clinical Impression Statement A: Pt reports she was sore after yesterday's session however felt a pop this morning and seems to have more range today. Continued with myofascial release to address fascial restrictions, passive stretching completed with notable improvement in abduction today. Progressed to A/ROM for protraction, horizontal abduction, and abduction in supine and standing today. Continued with shoulder stretches and increased time to 15" holds. Added proximal shoulder strengthening, continued with theraband and pulleys. Verbal cuing for form and technique.    Body Structure / Function / Physical Skills ADL;UE functional use;Fascial restriction;ROM;Strength    Plan P: continue with increasing ROM, trial subscapularis release and er muscle energy technique    OT Home Exercise Plan eval: table slides, row and extension scapular A/ROM 12/14: AA/ROM protraction, IR/er, flexion 12/21: shoulder stretches; 12/29: AA/ROM er and flexion, A/ROM protraction, horizontal abduction, and abduction    Consulted and Agree with Plan of Care Patient             Patient will benefit from skilled therapeutic intervention in order to improve the following deficits and impairments:   Body Structure / Function / Physical Skills: ADL, UE functional use, Fascial restriction, ROM, Strength       Visit Diagnosis: Other symptoms and signs involving the musculoskeletal system  Chronic right shoulder pain  Stiffness of right shoulder, not elsewhere classified    Problem List Patient Active Problem List   Diagnosis Date Noted   Stress 12/18/2020   Aortic atherosclerosis ([place]) 04/21/2020   Encounter for screening fecal occult blood testing 03/26/2020   Tobacco abuse 03/22/2020   Hyperlipidemia 03/05/2020   Mild sleep apnea 08/25/2017   Papanicolaou smear of cervix with positive high risk human papilloma virus (HPV) test 08/12/2017   Encounter for gynecological examination  with Papanicolaou smear of cervix 08/05/2017   Essential hypertension, benign 03/14/2013   Panic attack as reaction to stress 03/14/2013    Guadelupe Sabin, OTR/L  [phone] 08/14/2021, 3:56 PM  [facility] [place], [place], [address] Phone: [phone]   Fax:  [phone]  Name: Melinda Chapman MRN: [id number] Date of Birth: [birth year]

## 2021-08-18 ENCOUNTER — Encounter: Payer: Self-pay | Admitting: Gastroenterology

## 2021-08-19 ENCOUNTER — Encounter (HOSPITAL_COMMUNITY): Payer: Self-pay

## 2021-08-19 ENCOUNTER — Ambulatory Visit (HOSPITAL_COMMUNITY): Payer: BC Managed Care – PPO | Attending: Family Medicine

## 2021-08-19 ENCOUNTER — Other Ambulatory Visit: Payer: Self-pay

## 2021-08-19 DIAGNOSIS — M25611 Stiffness of right shoulder, not elsewhere classified: Secondary | ICD-10-CM

## 2021-08-19 DIAGNOSIS — R29898 Other symptoms and signs involving the musculoskeletal system: Secondary | ICD-10-CM | POA: Diagnosis not present

## 2021-08-19 DIAGNOSIS — M25511 Pain in right shoulder: Secondary | ICD-10-CM | POA: Diagnosis not present

## 2021-08-19 DIAGNOSIS — G8929 Other chronic pain: Secondary | ICD-10-CM | POA: Diagnosis not present

## 2021-08-19 NOTE — Therapy (Signed)
[facility] [address] [place], [place], [address] Phone: [phone]   Fax:  [phone]  Occupational Therapy Treatment  Patient Details  Name: Melinda Chapman MRN: [id number] Date of Birth: 1965-11-30 Referring Provider (OT): Sallee Lange, MD (sees Dr. Amedeo Kinsman, MD also. Luk[place] is PCP.)   Encounter Date: 08/19/2021   OT End of Session - 08/19/21 1629     Visit Number 8    Number of Visits 12    Date for OT Re-Evaluation 09/02/21    Authorization Type BCBS commercial PPO    Authorization Time Period copay $50, 30 visit limit    OT Start Time 1515    OT Stop Time 1553    OT Time Calculation (min) 38 min    Activity Tolerance Patient tolerated treatment well;Patient limited by pain    Behavior During Therapy [facility] for tasks assessed/performed             Past Medical History:  Diagnosis Date   Anxiety    Aortic atherosclerosis ([place]) 04/21/2020   Seen on CAT scan for lung cancer screening August 2021   Constipation    Diabetes in pregnancy    Diarrhea    GERD (gastroesophageal reflux disease)    Hyperlipidemia 03/05/2020   Hypertension    IBS (irritable bowel syndrome)    Papanicolaou smear of cervix with positive high risk human papilloma virus (HPV) test 08/12/2017    Past Surgical History:  Procedure Laterality Date   [place], 2002   x 2   CHOLECYSTECTOMY  2000   in [place]  2002    There were no vitals filed for this visit.   Subjective Assessment - 08/19/21 1533     Subjective  S: Yes, it's popped since I last saw you. Once with Magda Paganini here and once at home.    Currently in Pain? No/denies                [facility] OT Assessment - 08/19/21 1533       Assessment   Medical Diagnosis right frozen shoulder      Precautions   Precautions None                      OT Treatments/Exercises (OP) - 08/19/21 1534        Shoulder Exercises: Supine   Protraction PROM;5 reps;AROM;10 reps    Horizontal ABduction PROM;5 reps;AROM;10 reps    External Rotation PROM;5 reps;AAROM;10 reps   hold for 2-3" at end stretch   Internal Rotation PROM;5 reps;AAROM;10 reps    Flexion PROM;5 reps;AAROM;10 reps   hold for 2-3" at end stretch   ABduction PROM;5 reps;AROM;10 reps      Shoulder Exercises: Standing   Protraction AROM;10 reps    Horizontal ABduction AROM;10 reps    External Rotation AAROM;10 reps    Internal Rotation AAROM;10 reps    Flexion AAROM;10 reps    ABduction AROM;10 reps    Extension Theraband;10 reps    Theraband Level (Shoulder Extension) Level 2 (Red)    Row Theraband;10 reps    Theraband Level (Shoulder Row) Level 2 (Red)      Shoulder Exercises: ROM/Strengthening   Proximal Shoulder Strengthening, Supine 10X each, no rest breaks    Other ROM/Strengthening Exercises PVC pipe slide, 10X flexion      Shoulder Exercises: Stretch   External Rotation  Stretch 2 reps   1st rep completed with elbow adducted. then completed 2 repetitions with elbow just under 90 degrees flexion; used edge of wall; faced wall to decrease back arching.   Other Shoulder Stretches doorway stretch, 2x15"      Manual Therapy   Manual Therapy Muscle Energy Technique;Other (comment)    Manual therapy comments completed separately from therapeutic exercises    Other Manual Therapy subscapularis muscle release completed supine.    Muscle Energy Technique Muscle energy technique completed to right shoulder external rotators to decrease tone and muscle spasm and improve range of motion.                      OT Short Term Goals - 07/24/21 1512       OT SHORT TERM GOAL #1   Title Patient will be educated and independent with HEP in order to increase using her RUE as her dominant extremity for at least 25% or more of daily tasks.    Time 3    Period Weeks    Status On-going    Target Date 08/12/21       OT SHORT TERM GOAL #2   Title Patient will increase her RUE P/ROM to [facility] in order to increase ability to complete upper body dressing tasks with less difficulty.    Time 3    Period Weeks    Status On-going      OT SHORT TERM GOAL #3   Title Patient will decrease RUE fascial restrictions to moderate amount in order to increase the functional mobility needed to complete waist level reaching tasks with less difficulty.    Time 3    Period Weeks    Status On-going               OT Long Term Goals - 07/24/21 1513       OT LONG TERM GOAL #1   Title Pt will return to using her RUE as her dominant extremity for 50% or more of daily and work related tasks.    Time 6    Period Weeks    Status On-going    Target Date 09/02/21      OT LONG TERM GOAL #2   Title Patient will report a pain level of approximately 4/10 or less when completing bathing and dressing tasks with her RUE.    Time 6    Period Weeks    Status On-going      OT LONG TERM GOAL #3   Title Pt will increase her RUE A/ROM to [facility] in order to complete work related tasks, dressing, and bathing tasks with minimal difficulty.    Time 6    Period Weeks    Status On-going      OT LONG TERM GOAL #4   Title Pt will decrease RUE fascial restrictions to minimal amount or less in order to increase functional mobility needed to complete reaching tasks at shoulder level or above.    Time 6    Period Weeks    Status On-going      OT LONG TERM GOAL #5   Title Patient will demonstrate RUE shoulder strength of 4/5 or greater while lifting weighted household items of moderate weight with no difficulty.    Time 6    Period Weeks    Status On-going                   Plan - 08/19/21 1630  Clinical Impression Statement A: Completed muscle energy technique to increase external rotation this session with patient able to achieve further ROM. When complete subscapularis release, patient was initially extremely tender  until arm was positioned in horizontal abduction with two towels placed underneath. Provided VC and modifications during exercises or stretches for form and technique as needed. Patient has noteably made improvement since this therapist has seen her last. Continues to be lmited by pain causing her to muscle guard and does well to adjust with verbal and tactile cueing or with adjustments to form.    Body Structure / Function / Physical Skills ADL;UE functional use;Fascial restriction;ROM;Strength    Plan P: Continue with working on increasing ROM, continue with muscle energy technique.    Consulted and Agree with Plan of Care Patient             Patient will benefit from skilled therapeutic intervention in order to improve the following deficits and impairments:   Body Structure / Function / Physical Skills: ADL, UE functional use, Fascial restriction, ROM, Strength       Visit Diagnosis: Chronic right shoulder pain  Stiffness of right shoulder, not elsewhere classified  Other symptoms and signs involving the musculoskeletal system    Problem List Patient Active Problem List   Diagnosis Date Noted   Stress 12/18/2020   Aortic atherosclerosis ([place]) 04/21/2020   Encounter for screening fecal occult blood testing 03/26/2020   Tobacco abuse 03/22/2020   Hyperlipidemia 03/05/2020   Mild sleep apnea 08/25/2017   Papanicolaou smear of cervix with positive high risk human papilloma virus (HPV) test 08/12/2017   Encounter for gynecological examination with Papanicolaou smear of cervix 08/05/2017   Essential hypertension, benign 03/14/2013   Panic attack as reaction to stress 03/14/2013    Ailene Ravel, OTR/L,CBIS  [phone]  08/19/2021, 4:33 PM  [facility] [place], [place], [address] Phone: [phone]   Fax:  [phone]  Name: TARIA CASTRILLO MRN: [id number] Date of Birth: May 01, 1966

## 2021-08-21 ENCOUNTER — Ambulatory Visit (HOSPITAL_COMMUNITY): Payer: BC Managed Care – PPO | Admitting: Occupational Therapy

## 2021-08-21 ENCOUNTER — Encounter (HOSPITAL_COMMUNITY): Payer: Self-pay | Admitting: Occupational Therapy

## 2021-08-21 ENCOUNTER — Other Ambulatory Visit: Payer: Self-pay

## 2021-08-21 DIAGNOSIS — M25611 Stiffness of right shoulder, not elsewhere classified: Secondary | ICD-10-CM

## 2021-08-21 DIAGNOSIS — R29898 Other symptoms and signs involving the musculoskeletal system: Secondary | ICD-10-CM | POA: Diagnosis not present

## 2021-08-21 DIAGNOSIS — M25511 Pain in right shoulder: Secondary | ICD-10-CM

## 2021-08-21 DIAGNOSIS — G8929 Other chronic pain: Secondary | ICD-10-CM

## 2021-08-21 NOTE — Therapy (Signed)
[facility] [address] [place], [place], [address] Phone: [phone]   Fax:  [phone]  Occupational Therapy Treatment  Patient Details  Name: Melinda Chapman MRN: [id number] Date of Birth: 12/30/1965 Referring Provider (OT): Sallee Lange, MD (sees Dr. Amedeo Kinsman, MD also. [place] is PCP.)   Encounter Date: 08/21/2021   OT End of Session - 08/21/21 1704     Visit Number 9    Number of Visits 12    Date for OT Re-Evaluation 09/02/21    Authorization Type BCBS commercial PPO    Authorization Time Period copay $50, 30 visit limit    OT Start Time 2836    OT Stop Time 1559    OT Time Calculation (min) 41 min    Activity Tolerance Patient tolerated treatment well;Patient limited by pain    Behavior During Therapy [facility] for tasks assessed/performed             Past Medical History:  Diagnosis Date   Anxiety    Aortic atherosclerosis ([place]) 04/21/2020   Seen on CAT scan for lung cancer screening August 2021   Constipation    Diabetes in pregnancy    Diarrhea    GERD (gastroesophageal reflux disease)    Hyperlipidemia 03/05/2020   Hypertension    IBS (irritable bowel syndrome)    Papanicolaou smear of cervix with positive high risk human papilloma virus (HPV) test 08/12/2017    Past Surgical History:  Procedure Laterality Date   [place], 2002   x 2   CHOLECYSTECTOMY  2000   in [place]  2002    There were no vitals filed for this visit.   Subjective Assessment - 08/21/21 1521     Subjective  S: I've been aching like a toothache.    Currently in Pain? Yes                          OT Treatments/Exercises (OP) - 08/21/21 1522       Exercises   Exercises Shoulder      Shoulder Exercises: Supine   Protraction PROM;5 reps    Horizontal ABduction PROM;5 reps    External Rotation PROM;5 reps    Internal Rotation PROM;5 reps     Flexion PROM;5 reps    ABduction PROM;5 reps      Shoulder Exercises: Standing   Protraction AROM;10 reps    Horizontal ABduction AROM;10 reps    External Rotation AROM;10 reps    Internal Rotation AROM;10 reps    Flexion AAROM;10 reps    ABduction AROM;10 reps    Extension Theraband;10 reps    Theraband Level (Shoulder Extension) Level 2 (Red)    Row Theraband;10 reps    Theraband Level (Shoulder Row) Level 2 (Red)      Shoulder Exercises: ROM/Strengthening   UBE (Upper Arm Bike) Level 1 3' reverse, pace: 4.0    Other ROM/Strengthening Exercises Walking up and down finger ladder, 3x, reaching 12th rung      Shoulder Exercises: Stretch   Cross Chest Stretch 2 reps;20 seconds    Internal Rotation Stretch 2 reps   20" holds with horizontal towel   Wall Stretch - Flexion 2 reps;20 seconds      Manual Therapy   Manual Therapy Muscle Energy Technique;Other (comment)    Manual therapy comments completed separately from therapeutic exercises  Other Manual Therapy subscapularis muscle release completed supine.    Muscle Energy Technique Muscle energy technique completed to right shoulder external rotators to decrease tone and muscle spasm and improve range of motion.                      OT Short Term Goals - 07/24/21 1512       OT SHORT TERM GOAL #1   Title Patient will be educated and independent with HEP in order to increase using her RUE as her dominant extremity for at least 25% or more of daily tasks.    Time 3    Period Weeks    Status On-going    Target Date 08/12/21      OT SHORT TERM GOAL #2   Title Patient will increase her RUE P/ROM to [facility] in order to increase ability to complete upper body dressing tasks with less difficulty.    Time 3    Period Weeks    Status On-going      OT SHORT TERM GOAL #3   Title Patient will decrease RUE fascial restrictions to moderate amount in order to increase the functional mobility needed to complete waist level  reaching tasks with less difficulty.    Time 3    Period Weeks    Status On-going               OT Long Term Goals - 07/24/21 1513       OT LONG TERM GOAL #1   Title Pt will return to using her RUE as her dominant extremity for 50% or more of daily and work related tasks.    Time 6    Period Weeks    Status On-going    Target Date 09/02/21      OT LONG TERM GOAL #2   Title Patient will report a pain level of approximately 4/10 or less when completing bathing and dressing tasks with her RUE.    Time 6    Period Weeks    Status On-going      OT LONG TERM GOAL #3   Title Pt will increase her RUE A/ROM to [facility] in order to complete work related tasks, dressing, and bathing tasks with minimal difficulty.    Time 6    Period Weeks    Status On-going      OT LONG TERM GOAL #4   Title Pt will decrease RUE fascial restrictions to minimal amount or less in order to increase functional mobility needed to complete reaching tasks at shoulder level or above.    Time 6    Period Weeks    Status On-going      OT LONG TERM GOAL #5   Title Patient will demonstrate RUE shoulder strength of 4/5 or greater while lifting weighted household items of moderate weight with no difficulty.    Time 6    Period Weeks    Status On-going                   Plan - 08/21/21 1539     Clinical Impression Statement P: Continued with subscapularis release with pt in prone positioning, much improvement in comfort and tolerance to technique. Continued with myofascial release to address fascial restrictions. Progressed to all A/ROM in standing, continued with sustained stretches. Pt with improvement in ROM reaching near [facility] today. Pt completing scapular therabands and rung walk. Verbal cuing for form and technique.    Body  Structure / Function / Physical Skills ADL;UE functional use;Fascial restriction;ROM;Strength    Plan P: continue with manual techniques in prone, A/ROM, add scapular theraband  retraction    OT Home Exercise Plan eval: table slides, row and extension scapular A/ROM 12/14: AA/ROM protraction, IR/er, flexion 12/21: shoulder stretches; 12/29: AA/ROM er and flexion, A/ROM protraction, horizontal abduction, and abduction    Consulted and Agree with Plan of Care Patient             Patient will benefit from skilled therapeutic intervention in order to improve the following deficits and impairments:   Body Structure / Function / Physical Skills: ADL, UE functional use, Fascial restriction, ROM, Strength       Visit Diagnosis: Chronic right shoulder pain  Stiffness of right shoulder, not elsewhere classified  Other symptoms and signs involving the musculoskeletal system    Problem List Patient Active Problem List   Diagnosis Date Noted   Stress 12/18/2020   Aortic atherosclerosis ([place]) 04/21/2020   Encounter for screening fecal occult blood testing 03/26/2020   Tobacco abuse 03/22/2020   Hyperlipidemia 03/05/2020   Mild sleep apnea 08/25/2017   Papanicolaou smear of cervix with positive high risk human papilloma virus (HPV) test 08/12/2017   Encounter for gynecological examination with Papanicolaou smear of cervix 08/05/2017   Essential hypertension, benign 03/14/2013   Panic attack as reaction to stress 03/14/2013    Guadelupe Sabin, OTR/L  [phone] 08/21/2021, 5:11 PM  [facility] [address] [place], [place], [address] Phone: [phone]   Fax:  [phone]  Name: BERTHE OLEY MRN: [id number] Date of Birth: 05/02/1966

## 2021-08-26 ENCOUNTER — Ambulatory Visit (HOSPITAL_COMMUNITY): Payer: BC Managed Care – PPO | Admitting: Occupational Therapy

## 2021-08-26 ENCOUNTER — Encounter (HOSPITAL_COMMUNITY): Payer: Self-pay | Admitting: Occupational Therapy

## 2021-08-26 ENCOUNTER — Other Ambulatory Visit: Payer: Self-pay

## 2021-08-26 DIAGNOSIS — M25511 Pain in right shoulder: Secondary | ICD-10-CM

## 2021-08-26 DIAGNOSIS — R29898 Other symptoms and signs involving the musculoskeletal system: Secondary | ICD-10-CM

## 2021-08-26 DIAGNOSIS — M25611 Stiffness of right shoulder, not elsewhere classified: Secondary | ICD-10-CM

## 2021-08-26 DIAGNOSIS — G8929 Other chronic pain: Secondary | ICD-10-CM | POA: Diagnosis not present

## 2021-08-26 NOTE — Therapy (Signed)
[facility] [address] [place], [place], [address] Phone: [phone]   Fax:  [phone]  Occupational Therapy Treatment  Patient Details  Name: Melinda Chapman MRN: [id number] Date of Birth: [birth year] Referring Provider (OT): Sallee Lange, MD (sees Dr. Amedeo Kinsman, MD also. [place] is PCP.)   Encounter Date: 08/26/2021   OT End of Session - 08/26/21 1556     Visit Number 10    Number of Visits 12    Date for OT Re-Evaluation 09/02/21    Authorization Type BCBS commercial PPO    Authorization Time Period copay $50, 30 visit limit    OT Start Time 1517    OT Stop Time 1556    OT Time Calculation (min) 39 min    Activity Tolerance Patient tolerated treatment well;Patient limited by pain    Behavior During Therapy [facility] for tasks assessed/performed             Past Medical History:  Diagnosis Date   Anxiety    Aortic atherosclerosis ([place]) 04/21/2020   Seen on CAT scan for lung cancer screening August 2021   Constipation    Diabetes in pregnancy    Diarrhea    GERD (gastroesophageal reflux disease)    Hyperlipidemia 03/05/2020   Hypertension    IBS (irritable bowel syndrome)    Papanicolaou smear of cervix with positive high risk human papilloma virus (HPV) test 08/12/2017    Past Surgical History:  Procedure Laterality Date   [place], 2002   x 2   CHOLECYSTECTOMY  2000   in [place]  2002    There were no vitals filed for this visit.   Subjective Assessment - 08/26/21 1519     Subjective  S: My main problem is now is sleeping.    Currently in Pain? No/denies                [facility] OT Assessment - 08/26/21 1518       Assessment   Medical Diagnosis right frozen shoulder      Precautions   Precautions None                      OT Treatments/Exercises (OP) - 08/26/21 1519       Exercises   Exercises Shoulder       Shoulder Exercises: Supine   Protraction PROM;5 reps;AROM;10 reps    Horizontal ABduction PROM;5 reps    External Rotation PROM;5 reps    Internal Rotation PROM;5 reps;AAROM;10 reps    Flexion PROM;5 reps;AAROM;10 reps    ABduction PROM;5 reps      Shoulder Exercises: Standing   Protraction AROM;10 reps    Horizontal ABduction AROM;10 reps    External Rotation AAROM;10 reps    Internal Rotation AAROM;10 reps    Flexion AAROM;10 reps   holding for a count of 2 at end range   ABduction AROM;10 reps    Extension Theraband;10 reps    Theraband Level (Shoulder Extension) Level 2 (Red)    Row Theraband;10 reps    Theraband Level (Shoulder Row) Level 2 (Red)    Retraction Theraband;10 reps    Theraband Level (Shoulder Retraction) Level 2 (Red)      Shoulder Exercises: ROM/Strengthening   UBE (Upper Arm Bike) Level 1 3' reverse, pace: 4.0    Other ROM/Strengthening Exercises PVC pipe slide, 10X flexion and  10X abduction    Other ROM/Strengthening Exercises Walking up and down finger ladder, 3x, reaching 12th rung      Manual Therapy   Manual Therapy Muscle Energy Technique;Other (comment)    Manual therapy comments completed separately from therapeutic exercises    Other Manual Therapy subscapularis muscle release completed supine.    Muscle Energy Technique Muscle energy technique completed to right shoulder external rotators to decrease tone and muscle spasm and improve range of motion.                      OT Short Term Goals - 07/24/21 1512       OT SHORT TERM GOAL #1   Title Patient will be educated and independent with HEP in order to increase using her RUE as her dominant extremity for at least 25% or more of daily tasks.    Time 3    Period Weeks    Status On-going    Target Date 08/12/21      OT SHORT TERM GOAL #2   Title Patient will increase her RUE P/ROM to [facility] in order to increase ability to complete upper body dressing tasks with less difficulty.     Time 3    Period Weeks    Status On-going      OT SHORT TERM GOAL #3   Title Patient will decrease RUE fascial restrictions to moderate amount in order to increase the functional mobility needed to complete waist level reaching tasks with less difficulty.    Time 3    Period Weeks    Status On-going               OT Long Term Goals - 07/24/21 1513       OT LONG TERM GOAL #1   Title Pt will return to using her RUE as her dominant extremity for 50% or more of daily and work related tasks.    Time 6    Period Weeks    Status On-going    Target Date 09/02/21      OT LONG TERM GOAL #2   Title Patient will report a pain level of approximately 4/10 or less when completing bathing and dressing tasks with her RUE.    Time 6    Period Weeks    Status On-going      OT LONG TERM GOAL #3   Title Pt will increase her RUE A/ROM to [facility] in order to complete work related tasks, dressing, and bathing tasks with minimal difficulty.    Time 6    Period Weeks    Status On-going      OT LONG TERM GOAL #4   Title Pt will decrease RUE fascial restrictions to minimal amount or less in order to increase functional mobility needed to complete reaching tasks at shoulder level or above.    Time 6    Period Weeks    Status On-going      OT LONG TERM GOAL #5   Title Patient will demonstrate RUE shoulder strength of 4/5 or greater while lifting weighted household items of moderate weight with no difficulty.    Time 6    Period Weeks    Status On-going                   Plan - 08/26/21 1554     Clinical Impression Statement A: Pt reports Saturday was a rough day but her pain has improved since then.  Continued with manual techniques including prone work on the teres and subscapularis, passive stretching completed. Pt completing A/ROM and some AA/ROM with holds at end range. Added retraction with scapular theraband today. Verbal cuing for form and technique during tasks.    Body  Structure / Function / Physical Skills ADL;UE functional use;Fascial restriction;ROM;Strength    Plan P: continue with manual techniques in prone, A/ROM, shoulder stretches    OT Home Exercise Plan eval: table slides, row and extension scapular A/ROM 12/14: AA/ROM protraction, IR/er, flexion 12/21: shoulder stretches; 12/29: AA/ROM er and flexion, A/ROM protraction, horizontal abduction, and abduction    Consulted and Agree with Plan of Care Patient             Patient will benefit from skilled therapeutic intervention in order to improve the following deficits and impairments:   Body Structure / Function / Physical Skills: ADL, UE functional use, Fascial restriction, ROM, Strength       Visit Diagnosis: Chronic right shoulder pain  Stiffness of right shoulder, not elsewhere classified  Other symptoms and signs involving the musculoskeletal system    Problem List Patient Active Problem List   Diagnosis Date Noted   Stress 12/18/2020   Aortic atherosclerosis ([place]) 04/21/2020   Encounter for screening fecal occult blood testing 03/26/2020   Tobacco abuse 03/22/2020   Hyperlipidemia 03/05/2020   Mild sleep apnea 08/25/2017   Papanicolaou smear of cervix with positive high risk human papilloma virus (HPV) test 08/12/2017   Encounter for gynecological examination with Papanicolaou smear of cervix 08/05/2017   Essential hypertension, benign 03/14/2013   Panic attack as reaction to stress 03/14/2013    Guadelupe Sabin, OTR/L  [phone] 08/26/2021, 3:57 PM  [facility] [place], [place], [address] Phone: [phone]   Fax:  [phone]  Name: Melinda Chapman MRN: [id number] Date of Birth: 03-Aug-1966

## 2021-08-28 ENCOUNTER — Other Ambulatory Visit: Payer: Self-pay

## 2021-08-28 ENCOUNTER — Encounter (HOSPITAL_COMMUNITY): Payer: Self-pay | Admitting: Occupational Therapy

## 2021-08-28 ENCOUNTER — Ambulatory Visit (HOSPITAL_COMMUNITY): Payer: BC Managed Care – PPO | Admitting: Occupational Therapy

## 2021-08-28 DIAGNOSIS — M25611 Stiffness of right shoulder, not elsewhere classified: Secondary | ICD-10-CM | POA: Diagnosis not present

## 2021-08-28 DIAGNOSIS — G8929 Other chronic pain: Secondary | ICD-10-CM | POA: Diagnosis not present

## 2021-08-28 DIAGNOSIS — M25511 Pain in right shoulder: Secondary | ICD-10-CM | POA: Diagnosis not present

## 2021-08-28 DIAGNOSIS — R29898 Other symptoms and signs involving the musculoskeletal system: Secondary | ICD-10-CM | POA: Diagnosis not present

## 2021-08-28 NOTE — Patient Instructions (Signed)

## 2021-08-28 NOTE — Therapy (Signed)
[place], [place], [address] Phone: [phone]   Fax:  [phone]  Occupational Therapy Reassessment, Treatment, Discharge Summary  Patient Details  Name: Melinda Chapman MRN: [id number] Date of Birth: 1965/10/22 Referring Provider (OT): Sallee Lange, MD (sees Dr. Amedeo Kinsman, MD also. [place] is PCP.)   Encounter Date: 08/28/2021   OT End of Session - 08/28/21 1556     Visit Number 11    Number of Visits 12    Date for OT Re-Evaluation 09/02/21    Authorization Type [employer] commercial PPO    Authorization Time Period copay $50, 30 visit limit    OT Start Time 4235    OT Stop Time 1552    OT Time Calculation (min) 36 min    Activity Tolerance Patient tolerated treatment well;Patient limited by pain    Behavior During Therapy [facility] for tasks assessed/performed             Past Medical History:  Diagnosis Date   Anxiety    Aortic atherosclerosis ([place]) 04/21/2020   Seen on CAT scan for lung cancer screening August 2021   Constipation    Diabetes in pregnancy    Diarrhea    GERD (gastroesophageal reflux disease)    Hyperlipidemia 03/05/2020   Hypertension    IBS (irritable bowel syndrome)    Papanicolaou smear of cervix with positive high risk human papilloma virus (HPV) test 08/12/2017    Past Surgical History:  Procedure Laterality Date   [place], 2002   x 2   CHOLECYSTECTOMY  2000   in [place]  2002    There were no vitals filed for this visit.   Subjective Assessment - 08/28/21 1516     Subjective  S: It's feeling pretty good.    Currently in Pain? No/denies                [facility] OT Assessment - 08/28/21 1515       Assessment   Medical Diagnosis right frozen shoulder      Precautions   Precautions None      Observation/Other Assessments   Focus on Therapeutic Outcomes (FOTO)  61/100   44/100 previous      AROM   Overall AROM Comments Assessed seated. IR/er adducted    AROM Assessment Site Shoulder    Right/Left Shoulder Right    Right Shoulder Flexion 130 Degrees   93 previous   Right Shoulder ABduction 135 Degrees   90 previous   Right Shoulder Internal Rotation 90 Degrees   same as previous   Right Shoulder External Rotation 50 Degrees   28 previous     PROM   Overall PROM Comments Assessed supine. IR/er adducted    PROM Assessment Site Shoulder    Right/Left Shoulder Right    Right Shoulder Flexion 145 Degrees   111 previous   Right Shoulder ABduction 146 Degrees   129 previous   Right Shoulder Internal Rotation 90 Degrees   same as previous   Right Shoulder External Rotation 50 Degrees   35 previous     Strength   Strength Assessment Site Shoulder    Right/Left Shoulder Right    Right Shoulder Flexion 4+/5   not previously assessed   Right Shoulder ABduction 4+/5   not previously assessed   Right Shoulder Internal Rotation 4+/5   not previously assessed  Right Shoulder External Rotation 4/5   not previously assessed                     OT Treatments/Exercises (OP) - 08/28/21 1518       Exercises   Exercises Shoulder      Shoulder Exercises: Supine   Protraction PROM;5 reps    Horizontal ABduction PROM;5 reps;AROM;10 reps    External Rotation PROM;5 reps    Internal Rotation PROM;5 reps    Flexion PROM;5 reps;AROM;10 reps    ABduction PROM;5 reps;AROM;10 reps      Shoulder Exercises: Standing   Protraction AROM;10 reps    Horizontal ABduction AROM;10 reps    External Rotation AROM;10 reps    Internal Rotation AROM;10 reps    Flexion AROM;10 reps    ABduction AROM;10 reps    Extension Theraband;10 reps    Theraband Level (Shoulder Extension) Level 2 (Red)    Row Theraband;10 reps    Theraband Level (Shoulder Row) Level 2 (Red)    Retraction Theraband;10 reps    Theraband Level (Shoulder Retraction) Level 2 (Red)      Manual Therapy   Manual  Therapy Muscle Energy Technique;Other (comment)    Manual therapy comments completed separately from therapeutic exercises    Other Manual Therapy subscapularis muscle release completed supine.    Muscle Energy Technique Muscle energy technique completed to right shoulder external rotators to decrease tone and muscle spasm and improve range of motion.                    OT Education - 08/28/21 1538     Education Details red scapular theraband    Person(s) Educated Patient    Methods Explanation;Demonstration;Verbal cues;Handout    Comprehension Returned demonstration;Verbalized understanding              OT Short Term Goals - 08/28/21 1556       OT SHORT TERM GOAL #1   Title Patient will be educated and independent with HEP in order to increase using her RUE as her dominant extremity for at least 25% or more of daily tasks.    Time 3    Period Weeks    Status Achieved    Target Date 08/12/21      OT SHORT TERM GOAL #2   Title Patient will increase her RUE P/ROM to [facility] in order to increase ability to complete upper body dressing tasks with less difficulty.    Time 3    Period Weeks    Status Achieved      OT SHORT TERM GOAL #3   Title Patient will decrease RUE fascial restrictions to moderate amount in order to increase the functional mobility needed to complete waist level reaching tasks with less difficulty.    Time 3    Period Weeks    Status Achieved               OT Long Term Goals - 08/28/21 1556       OT LONG TERM GOAL #1   Title Pt will return to using her RUE as her dominant extremity for 50% or more of daily and work related tasks.    Time 6    Period Weeks    Status Achieved    Target Date 09/02/21      OT LONG TERM GOAL #2   Title Patient will report a pain level of approximately 4/10 or less when completing bathing and dressing tasks  with her RUE.    Time 6    Period Weeks    Status Achieved      OT LONG TERM GOAL #3   Title Pt  will increase her RUE A/ROM to [facility] in order to complete work related tasks, dressing, and bathing tasks with minimal difficulty.    Time 6    Period Weeks    Status Partially Met      OT LONG TERM GOAL #4   Title Pt will decrease RUE fascial restrictions to minimal amount or less in order to increase functional mobility needed to complete reaching tasks at shoulder level or above.    Time 6    Period Weeks    Status Achieved      OT LONG TERM GOAL #5   Title Patient will demonstrate RUE shoulder strength of 4/5 or greater while lifting weighted household items of moderate weight with no difficulty.    Time 6    Period Weeks    Status Achieved                   Plan - 08/28/21 1556     Clinical Impression Statement A: Reassessment completed this session, pt has met all STGs and 4/5 LTGs with remaining LTG partially met. Pt reports no pain today, she has had less pain since last session and did not have any difficulty sleeping last night. Pt demonstrates improvements in ROM, strength, and pain, fascial restrictions are decreasing. Pt would like to discharge with HEP for continued stretching and ROM. Provided scapular theraband as well.    Body Structure / Function / Physical Skills ADL;UE functional use;Fascial restriction;ROM;Strength    Plan P: Discharge pt    OT Home Exercise Plan eval: table slides, row and extension scapular A/ROM 12/14: AA/ROM protraction, IR/er, flexion 12/21: shoulder stretches; 12/29: AA/ROM er and flexion, A/ROM protraction, horizontal abduction, and abduction; 1/12: red scapular theraband    Consulted and Agree with Plan of Care Patient             Patient will benefit from skilled therapeutic intervention in order to improve the following deficits and impairments:   Body Structure / Function / Physical Skills: ADL, UE functional use, Fascial restriction, ROM, Strength       Visit Diagnosis: Chronic right shoulder pain  Stiffness of right  shoulder, not elsewhere classified  Other symptoms and signs involving the musculoskeletal system    Problem List Patient Active Problem List   Diagnosis Date Noted   Stress 12/18/2020   Aortic atherosclerosis ([place]) 04/21/2020   Encounter for screening fecal occult blood testing 03/26/2020   Tobacco abuse 03/22/2020   Hyperlipidemia 03/05/2020   Mild sleep apnea 08/25/2017   Papanicolaou smear of cervix with positive high risk human papilloma virus (HPV) test 08/12/2017   Encounter for gynecological examination with Papanicolaou smear of cervix 08/05/2017   Essential hypertension, benign 03/14/2013   Panic attack as reaction to stress 03/14/2013    Guadelupe Sabin, OTR/L  [phone] 08/28/2021, 4:35 PM  [address] [place], [place], [address] Phone: [phone]   Fax:  [phone]  Name: Melinda Chapman MRN: [id number] Date of Birth: [birth year]   OCCUPATIONAL THERAPY DISCHARGE SUMMARY  Visits from Start of Care: 11  Current functional level related to goals / functional outcomes: See above. Pt with improvements in pain, ROM, and strength and is incorporating the RUE into ADLs with significantly less pain.  Remaining deficits: Decreased ROM, occasional pain   Education / Equipment: HEP for shoulder stretches, scapular theraband, and A/ROM   Patient agrees to discharge. Patient goals were met. Patient is being discharged due to being pleased with the current functional level.Marland Kitchen

## 2021-09-02 ENCOUNTER — Ambulatory Visit (HOSPITAL_COMMUNITY): Payer: BC Managed Care – PPO | Admitting: Occupational Therapy

## 2021-09-04 ENCOUNTER — Ambulatory Visit (HOSPITAL_COMMUNITY): Payer: BC Managed Care – PPO

## 2022-01-15 ENCOUNTER — Encounter: Payer: Self-pay | Admitting: Family Medicine

## 2022-01-15 ENCOUNTER — Ambulatory Visit: Payer: BC Managed Care – PPO | Admitting: Family Medicine

## 2022-01-15 VITALS — BP 134/84 | HR 65 | Temp 98.6°F | Wt 117.0 lb

## 2022-01-15 DIAGNOSIS — R739 Hyperglycemia, unspecified: Secondary | ICD-10-CM

## 2022-01-15 DIAGNOSIS — E785 Hyperlipidemia, unspecified: Secondary | ICD-10-CM

## 2022-01-15 DIAGNOSIS — I10 Essential (primary) hypertension: Secondary | ICD-10-CM | POA: Diagnosis not present

## 2022-01-15 DIAGNOSIS — Z130 Encounter for screening for diseases of the blood and blood-forming organs and certain disorders involving the immune mechanism: Secondary | ICD-10-CM

## 2022-01-15 DIAGNOSIS — Z720 Tobacco use: Secondary | ICD-10-CM

## 2022-01-15 DIAGNOSIS — K219 Gastro-esophageal reflux disease without esophagitis: Secondary | ICD-10-CM

## 2022-01-15 DIAGNOSIS — F410 Panic disorder [episodic paroxysmal anxiety] without agoraphobia: Secondary | ICD-10-CM

## 2022-01-15 DIAGNOSIS — F430 Acute stress reaction: Secondary | ICD-10-CM

## 2022-01-15 MED ORDER — ESOMEPRAZOLE MAGNESIUM 40 MG PO CPDR: DELAYED_RELEASE_CAPSULE | ORAL | 1 refills | Status: DC

## 2022-01-15 MED ORDER — SERTRALINE HCL 25 MG PO TABS: 25.0000 mg | ORAL_TABLET | Freq: Every day | ORAL | 1 refills | Status: DC

## 2022-01-15 MED ORDER — LORAZEPAM 0.5 MG PO TABS: 0.5000 mg | ORAL_TABLET | Freq: Two times a day (BID) | ORAL | 0 refills | Status: DC | PRN

## 2022-01-15 MED ORDER — LISINOPRIL 20 MG PO TABS: 20.0000 mg | ORAL_TABLET | Freq: Every day | ORAL | 1 refills | Status: DC

## 2022-01-15 NOTE — Patient Instructions (Signed)
Labs ordered.   I have refilled your medication.  If we can help with smoking cessation please let us know.  Take care  Dr. Lacinda Axon

## 2022-01-16 DIAGNOSIS — K219 Gastro-esophageal reflux disease without esophagitis: Secondary | ICD-10-CM | POA: Insufficient documentation

## 2022-01-16 NOTE — Assessment & Plan Note (Signed)
Stable. Continue Lisinopril.  Refilled today.

## 2022-01-16 NOTE — Assessment & Plan Note (Signed)
Stable on Nexium.  Continue. 

## 2022-01-16 NOTE — Assessment & Plan Note (Signed)
Continue Zoloft and lorazepam.

## 2022-01-16 NOTE — Assessment & Plan Note (Signed)
Recommended smoking cessation. 

## 2022-01-16 NOTE — Assessment & Plan Note (Signed)
Has now discontinued lipid-lowering therapy.  Advised to discuss this with her primary care physician at follow-up.

## 2022-01-16 NOTE — Progress Notes (Signed)
Subjective:  Patient ID: Melinda Chapman, female    DOB: November 20, 1965  Age: 56 y.o. MRN: [id number]  CC: Chief Complaint  Patient presents with   Hypertension    HPI:  56 year old female with hypertension, GERD, aortic atherosclerosis, tobacco abuse, hyperlipidemia presents for follow-up.  Patient's hypertension is well controlled.  Patient is almost out of her medication.  Needs refill on lisinopril.  Patient's GERD is stable on Nexium.  Needs refill.  Patient is no longer taking Crestor for hyperlipidemia.  She states that she believes that it was giving her headaches.  Advised her that she needs to discuss this with her primary care provider.  Patient Active Problem List   Diagnosis Date Noted   GERD (gastroesophageal reflux disease) 01/16/2022   Stress 12/18/2020   Aortic atherosclerosis ([place]) 04/21/2020   Encounter for screening fecal occult blood testing 03/26/2020   Tobacco abuse 03/22/2020   Hyperlipidemia 03/05/2020   Mild sleep apnea 08/25/2017   Papanicolaou smear of cervix with positive high risk human papilloma virus (HPV) test 08/12/2017   Encounter for gynecological examination with Papanicolaou smear of cervix 08/05/2017   Essential hypertension, benign 03/14/2013   Panic attack as reaction to stress 03/14/2013    Social Hx   Social History   Socioeconomic History   Marital status: Divorced    Spouse name: Not on file   Number of children: 2   Years of education: Not on file   Highest education level: Not on file  Occupational History   Occupation: Network engineer  Tobacco Use   Smoking status: Every Day    Packs/day: 0.50    Years: 30.00    Pack years: 15.00    Types: Cigarettes   Smokeless tobacco: Never  Vaping Use   Vaping Use: Never used  Substance and Sexual Activity   Alcohol use: Yes    Alcohol/week: 5.0 standard drinks    Types: 5 Cans of beer per week    Comment: occasional   Drug use: No   Sexual activity: Not Currently    Birth  control/protection: Surgical    Comment: tubal and ablation  Other Topics Concern   Not on file  Social History Narrative   4 caffeine drinks daily    Social Determinants of Health   Financial Resource Strain: Not on file  Food Insecurity: Not on file  Transportation Needs: Not on file  Physical Activity: Not on file  Stress: Not on file  Social Connections: Not on file    Review of Systems  Constitutional: Negative.    Objective:  BP 134/84   Pulse 65   Temp 98.6 F (37 C)   Wt 117 lb (53.1 kg)   SpO2 97%   BMI 22.85 kg/m      01/15/2022    3:08 PM 01/15/2022    2:35 PM 07/09/2021   10:05 AM  BP/Weight  Systolic BP 888 280 034  Diastolic BP 84 62 84  Wt. (Lbs)  117 113  BMI  22.85 kg/m2 22.07 kg/m2    Physical Exam Constitutional:      General: She is not in acute distress.    Appearance: Normal appearance.  HENT:     Head: Normocephalic and atraumatic.  Cardiovascular:     Rate and Rhythm: Normal rate and regular rhythm.  Pulmonary:     Effort: Pulmonary effort is normal.     Breath sounds: Normal breath sounds. No wheezing, rhonchi or rales.  Neurological:     Mental Status:  She is alert.  Psychiatric:        Mood and Affect: Mood normal.        Behavior: Behavior normal.    Lab Results  Component Value Date   WBC 11.0 (H) 02/24/2013   HGB 17.7 (H) 02/24/2013   HCT 48.9 (H) 02/24/2013   PLT 164 02/24/2013   GLUCOSE 102 (H) 12/13/2020   CHOL 191 03/14/2021   TRIG 117 03/14/2021   HDL 70 03/14/2021   LDLCALC 101 (H) 03/14/2021   ALT 29 03/14/2021   AST 34 03/14/2021   NA 132 (L) 12/13/2020   K 4.3 12/13/2020   CL 93 (L) 12/13/2020   CREATININE 0.59 12/13/2020   BUN 8 12/13/2020   CO2 24 12/13/2020   TSH 2.580 09/25/2017     Assessment & Plan:   Problem List Items Addressed This Visit       Cardiovascular and Mediastinum   Essential hypertension, benign - Primary    Stable. Continue Lisinopril.  Refilled today.       Relevant  Medications   lisinopril (ZESTRIL) 20 MG tablet   Other Relevant Orders   CMP14+EGFR     Digestive   GERD (gastroesophageal reflux disease)    Stable on Nexium.  Continue.       Relevant Medications   esomeprazole (NEXIUM) 40 MG capsule     Other   Tobacco abuse    Recommended smoking cessation.       Panic attack as reaction to stress    Continue Zoloft and lorazepam.       Relevant Medications   LORazepam (ATIVAN) 0.5 MG tablet   sertraline (ZOLOFT) 25 MG tablet   Hyperlipidemia    Has now discontinued lipid-lowering therapy.  Advised to discuss this with her primary care physician at follow-up.       Relevant Medications   lisinopril (ZESTRIL) 20 MG tablet   Other Relevant Orders   Lipid panel   Other Visit Diagnoses     Blood glucose elevated       Relevant Orders   Hemoglobin A1c   Screening for deficiency anemia       Relevant Orders   CBC       Meds ordered this encounter  Medications   LORazepam (ATIVAN) 0.5 MG tablet    Sig: Take 1 tablet (0.5 mg total) by mouth 2 (two) times daily as needed for anxiety.    Dispense:  30 tablet    Refill:  0   sertraline (ZOLOFT) 25 MG tablet    Sig: Take 1 tablet (25 mg total) by mouth daily.    Dispense:  90 tablet    Refill:  1   esomeprazole (NEXIUM) 40 MG capsule    Sig: TAKE (1) CAPSULE BY MOUTH DAILY.    Dispense:  90 capsule    Refill:  1   lisinopril (ZESTRIL) 20 MG tablet    Sig: Take 1 tablet (20 mg total) by mouth daily.    Dispense:  90 tablet    Refill:  1    Follow-up:  Return in about 6 months (around 07/17/2022).  [place]

## 2022-02-06 DIAGNOSIS — Z130 Encounter for screening for diseases of the blood and blood-forming organs and certain disorders involving the immune mechanism: Secondary | ICD-10-CM | POA: Diagnosis not present

## 2022-02-06 DIAGNOSIS — E785 Hyperlipidemia, unspecified: Secondary | ICD-10-CM | POA: Diagnosis not present

## 2022-02-06 DIAGNOSIS — I10 Essential (primary) hypertension: Secondary | ICD-10-CM | POA: Diagnosis not present

## 2022-02-06 DIAGNOSIS — R739 Hyperglycemia, unspecified: Secondary | ICD-10-CM | POA: Diagnosis not present

## 2022-02-07 LAB — CBC
Hematocrit: 43.3 % (ref 34.0–46.6)
Hemoglobin: 14.7 g/dL (ref 11.1–15.9)
MCH: 33 pg (ref 26.6–33.0)
MCHC: 33.9 g/dL (ref 31.5–35.7)
MCV: 97 fL (ref 79–97)
Platelets: 225 10*3/uL (ref 150–450)
RBC: 4.46 x10E6/uL (ref 3.77–5.28)
RDW: 12.6 % (ref 11.7–15.4)
WBC: 8.8 10*3/uL (ref 3.4–10.8)

## 2022-02-07 LAB — CMP14+EGFR
ALT: 44 IU/L — ABNORMAL HIGH (ref 0–32)
AST: 30 IU/L (ref 0–40)
Albumin/Globulin Ratio: 2 (ref 1.2–2.2)
Albumin: 4.7 g/dL (ref 3.8–4.9)
Alkaline Phosphatase: 78 IU/L (ref 44–121)
BUN/Creatinine Ratio: 14 (ref 9–23)
BUN: 8 mg/dL (ref 6–24)
Bilirubin Total: 0.5 mg/dL (ref 0.0–1.2)
CO2: 24 mmol/L (ref 20–29)
Calcium: 9.3 mg/dL (ref 8.7–10.2)
Chloride: 97 mmol/L (ref 96–106)
Creatinine, Ser: 0.58 mg/dL (ref 0.57–1.00)
Globulin, Total: 2.4 g/dL (ref 1.5–4.5)
Glucose: 96 mg/dL (ref 70–99)
Potassium: 5 mmol/L (ref 3.5–5.2)
Sodium: 133 mmol/L — ABNORMAL LOW (ref 134–144)
Total Protein: 7.1 g/dL (ref 6.0–8.5)
eGFR: 105 mL/min/{1.73_m2} (ref >59)

## 2022-02-07 LAB — LIPID PANEL
Chol/HDL Ratio: 3.3 ratio (ref 0.0–4.4)
Cholesterol, Total: 234 mg/dL — ABNORMAL HIGH (ref 100–199)
HDL: 70 mg/dL (ref >39)
LDL Chol Calc (NIH): 142 mg/dL — ABNORMAL HIGH (ref 0–99)
Triglycerides: 128 mg/dL (ref 0–149)
VLDL Cholesterol Cal: 22 mg/dL (ref 5–40)

## 2022-02-07 LAB — HEMOGLOBIN A1C
Est. average glucose Bld gHb Est-mCnc: 103 mg/dL
Hgb A1c MFr Bld: 5.2 % (ref 4.8–5.6)

## 2022-03-05 DIAGNOSIS — H524 Presbyopia: Secondary | ICD-10-CM | POA: Diagnosis not present

## 2022-03-09 IMAGING — CT CT CHEST LUNG CANCER SCREENING LOW DOSE W/O CM
1 of 2 series · 10 of 20 positions shown, 13 images · non-contrast
Comparison: None.

CLINICAL DATA: 53-year-old female current smoker, with 37 pack-year
history of smoking, for initial lung cancer screening

EXAM:
CT CHEST WITHOUT CONTRAST LOW-DOSE FOR LUNG CANCER SCREENING
TECHNIQUE: Multidetector CT imaging of the chest was performed following the
standard protocol without IV contrast.

[ct lung segmentation data · axial · 0.59mm/px · z∈[+1127,+1127]mm · 10 of 313 frames shown]
[frame 1/313  mediastinal]
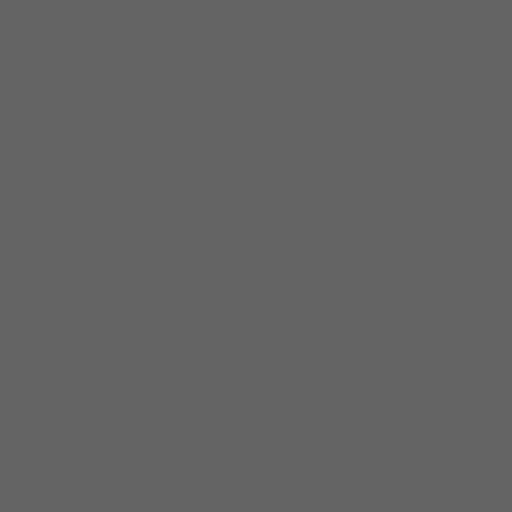
[frame 1/313  lung]
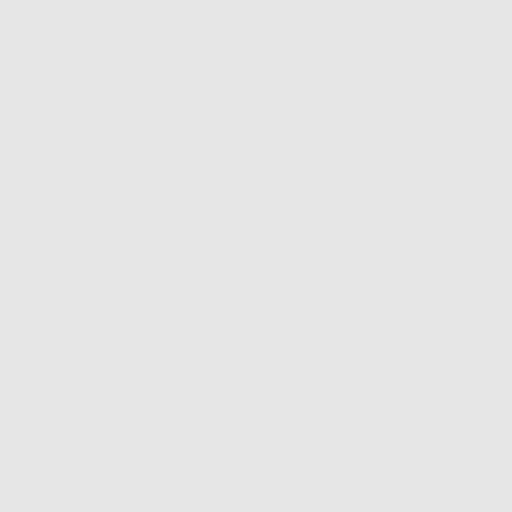
[frame 35/313  lung]
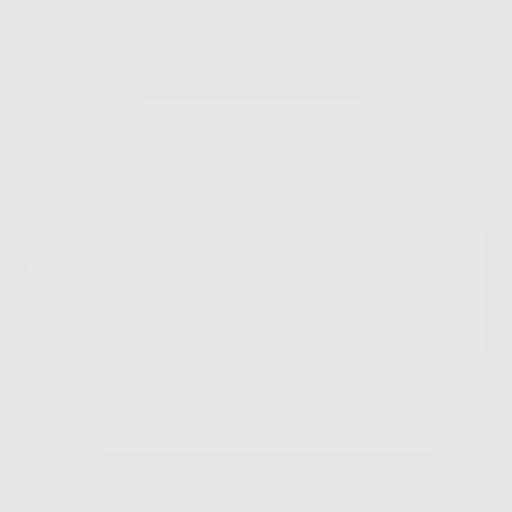
[frame 70/313  lung]
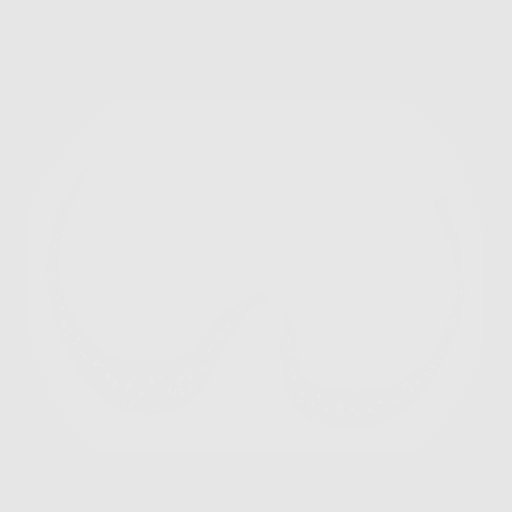
[frame 105/313  lung]
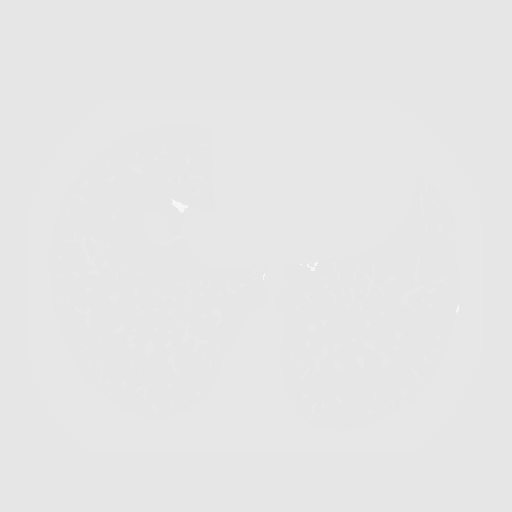
[frame 139/313  mediastinal]
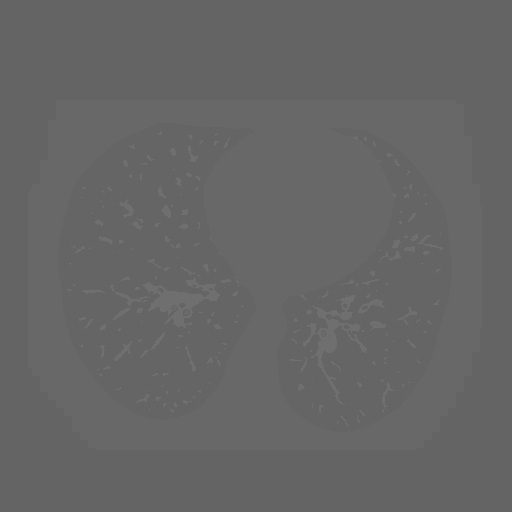
[frame 139/313  lung]
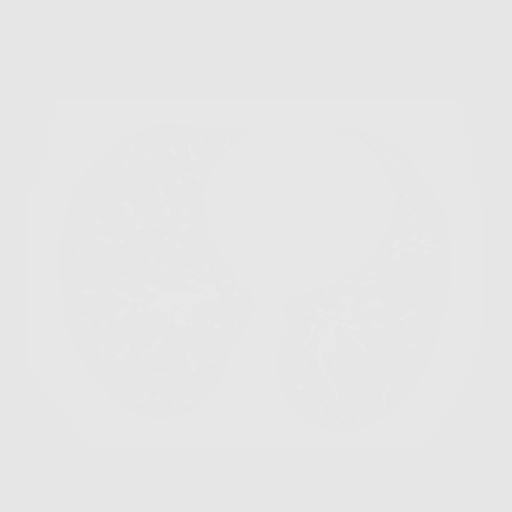
[frame 174/313  lung]
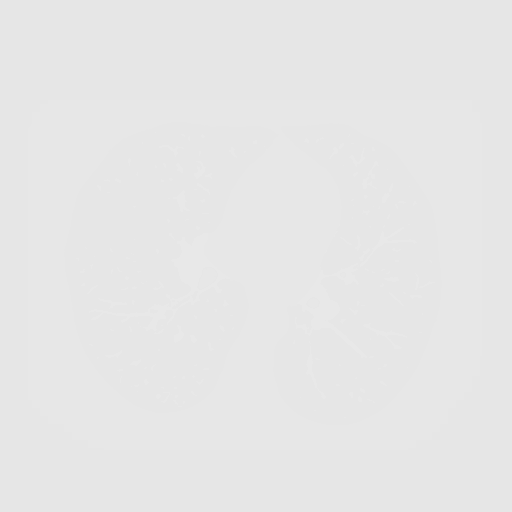
[frame 209/313  lung]
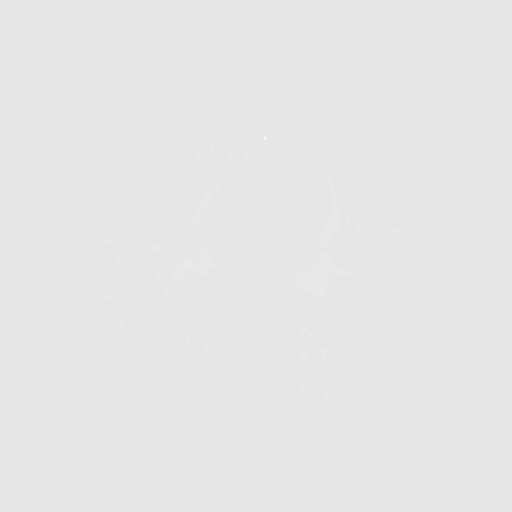
[frame 243/313  lung]
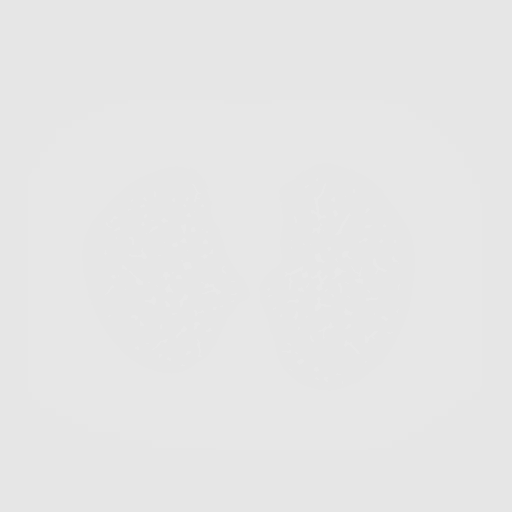
[frame 278/313  mediastinal]
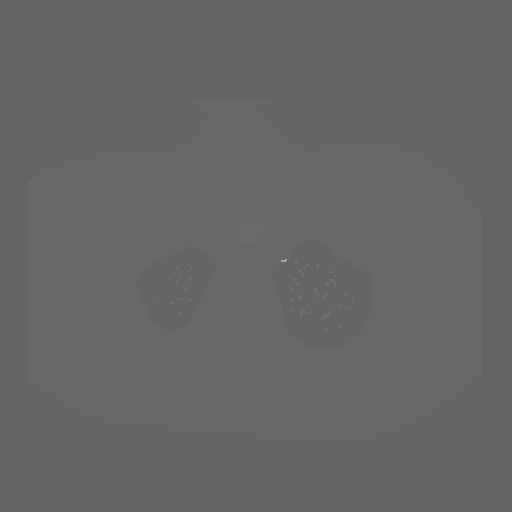
[frame 278/313  lung]
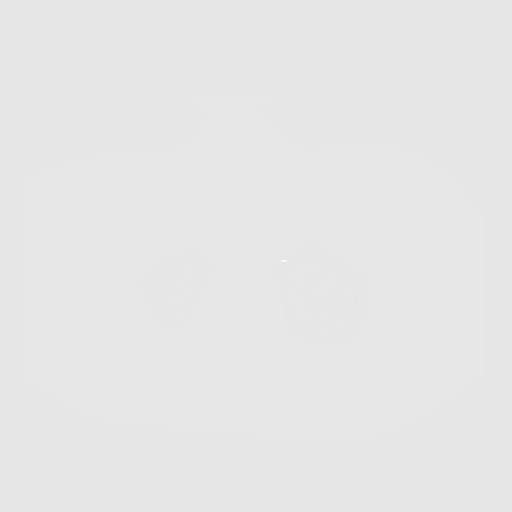
[frame 313/313  lung]
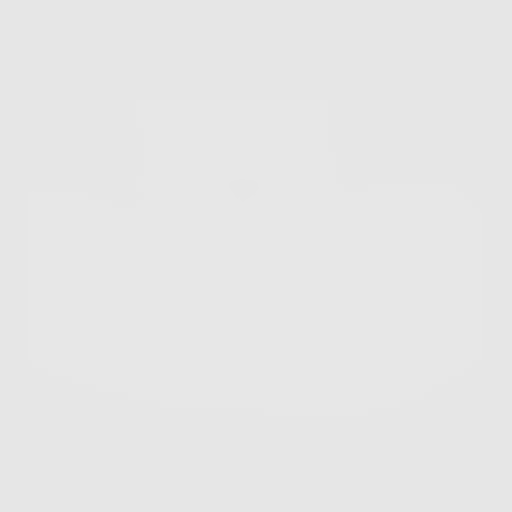

[10 of 20 positions shown; findings below may reference images not displayed]

FINDINGS: Cardiovascular: The heart is normal in size. No pericardial
effusion.

No evidence of thoracic aortic aneurysm. Mild Atherosclerotic
calcifications of the aortic arch.

Mediastinum/Nodes: No suspicious mediastinal lymphadenopathy.

Visualized thyroid is unremarkable.

Lungs/Pleura: Faint ground-glass centrilobular nodularity,
suggesting respiratory bronchiolitis or KA.

Scattered small bilateral pulmonary nodules, measuring up to 4.7 mm,
likely benign.

No focal consolidation.

No pleural effusion or pneumothorax.

Upper Abdomen: Visualized upper abdomen is notable for prior
cholecystectomy and vascular calcifications.

Musculoskeletal: Visualized osseous structures are within normal
limits.
IMPRESSION: Lung-RADS 2, benign appearance or behavior. Continue annual
screening with low-dose chest CT without contrast in 12 months.

Aortic Atherosclerosis (OM1HN-TS7.7).

## 2022-05-07 ENCOUNTER — Ambulatory Visit
Admission: RE | Admit: 2022-05-07 | Discharge: 2022-05-07 | Disposition: A | Discharge status: Home / Self Care | Payer: BC Managed Care – PPO | Source: Ambulatory Visit

## 2022-05-07 VITALS — BP 152/79 | HR 71 | Temp 98.4°F | Resp 16

## 2022-05-07 DIAGNOSIS — J0100 Acute maxillary sinusitis, unspecified: Secondary | ICD-10-CM

## 2022-05-07 MED ORDER — AZITHROMYCIN 250 MG PO TABS: 250.0000 mg | ORAL_TABLET | Freq: Every day | ORAL | 0 refills | Status: DC

## 2022-05-07 MED ORDER — FLUTICASONE PROPIONATE 50 MCG/ACT NA SUSP: 2.0000 | Freq: Every day | NASAL | 0 refills | Status: DC

## 2022-05-07 NOTE — ED Triage Notes (Signed)
Headache x 6 days.  That her nose feels sore and hurts to wear her glasses.  States she has had some green nasal congestion, but now it is clear.

## 2022-05-07 NOTE — ED Provider Notes (Signed)
[facility]    CSN: [id number] Arrival date & time: 05/07/22  1253      History   Chief Complaint Chief Complaint  Patient presents with   Headache    Constant headache for 6 days. Nasal pain, soreness to the touch. Possible sinus infection? - Entered by patient    HPI Melinda Chapman is a 56 y.o. female.   The history is provided by the patient.   Patient presents with a 6-day history of nasal congestion and headache.  Patient states over the past 2 to 3 days she has developed pain around her nose and around her cheeks.  She states that initially she did have some green nasal drainage, but now it is clear.  He also complains of bilateral ear fullness and pressure.  She states that she has been taking ibuprofen and Tylenol for her headache with minimal relief.  She denies fever, chills, ear drainage, sore throat, cough, or GI symptoms.  Patient states that she does not recall having a history of seasonal allergies.  She also denies previous history of migraines.  Patient states that if she does need an antibiotic that she be given azithromycin as this does not disrupt her GI pattern.  Past Medical History:  Diagnosis Date   Anxiety    Aortic atherosclerosis ([place]) 04/21/2020   Seen on CAT scan for lung cancer screening August 2021   Constipation    Diabetes in pregnancy    Diarrhea    GERD (gastroesophageal reflux disease)    Hyperlipidemia 03/05/2020   Hypertension    IBS (irritable bowel syndrome)    Papanicolaou smear of cervix with positive high risk human papilloma virus (HPV) test 08/12/2017    Patient Active Problem List   Diagnosis Date Noted   GERD (gastroesophageal reflux disease) 01/16/2022   Stress 12/18/2020   Aortic atherosclerosis ([place]) 04/21/2020   Encounter for screening fecal occult blood testing 03/26/2020   Tobacco abuse 03/22/2020   Hyperlipidemia 03/05/2020   Mild sleep apnea 08/25/2017   Papanicolaou smear of cervix with positive high  risk human papilloma virus (HPV) test 08/12/2017   Encounter for gynecological examination with Papanicolaou smear of cervix 08/05/2017   Essential hypertension, benign 03/14/2013   Panic attack as reaction to stress 03/14/2013    Past Surgical History:  Procedure Laterality Date   [place], 2002   x 2   CHOLECYSTECTOMY  2000   in [place]  2002    OB History     Gravida  2   Para  2   Term  2   Preterm      AB      Living  2      SAB      IAB      Ectopic      Multiple      Live Births  2            Home Medications    Prior to Admission medications   Medication Sig Start Date End Date Taking? Authorizing Provider  azithromycin (ZITHROMAX) 250 MG tablet Take 1 tablet (250 mg total) by mouth daily. Take first 2 tablets together, then 1 every day until finished. 05/07/22  Yes Rogue Rafalski-Warren, Alda Lea, NP  fluticasone (FLONASE) 50 MCG/ACT nasal spray Place 2 sprays into both nostrils daily. 05/07/22  Yes Viki Carrera-Warren, Alda Lea, NP  omeprazole (PRILOSEC) 20 MG  capsule Take 20 mg by mouth daily.   Yes [provider]  esomeprazole (NEXIUM) 40 MG capsule TAKE (1) CAPSULE BY MOUTH DAILY. 01/15/22   Coral Spikes, DO  lisinopril (ZESTRIL) 20 MG tablet Take 1 tablet (20 mg total) by mouth daily. 01/15/22   Coral Spikes, DO  LORazepam (ATIVAN) 0.5 MG tablet Take 1 tablet (0.5 mg total) by mouth 2 (two) times daily as needed for anxiety. 01/15/22   Coral Spikes, DO  Multiple Vitamin (MULTIVITAMIN ADULT PO) Take by mouth.    [provider]  rosuvastatin (CRESTOR) 5 MG tablet 1 q Monday and 1 qfriday Patient not taking: Reported on 01/15/2022 07/01/21   Kathyrn Drown, MD  sertraline (ZOLOFT) 25 MG tablet Take 1 tablet (25 mg total) by mouth daily. 01/15/22   Coral Spikes, DO    Family History Family History  Problem Relation Age of Onset   Colon cancer Paternal  Grandmother    Hypertension Mother    Hyperlipidemia Mother    Colon polyps Father    Colon polyps Brother    Colon polyps Paternal Aunt    Esophageal cancer Neg Hx    Stomach cancer Neg Hx     Social History Social History   Tobacco Use   Smoking status: Every Day    Packs/day: 0.50    Years: 30.00    Total pack years: 15.00    Types: Cigarettes   Smokeless tobacco: Never  Vaping Use   Vaping Use: Never used  Substance Use Topics   Alcohol use: Yes    Alcohol/week: 5.0 standard drinks of alcohol    Types: 5 Cans of beer per week    Comment: occasional   Drug use: No     Allergies   Patient has no known allergies.   Review of Systems Review of Systems Per HPI  Physical Exam Triage Vital Signs ED Triage Vitals  Enc Vitals Group     BP 05/07/22 1323 (!) 152/79     Pulse Rate 05/07/22 1323 71     Resp 05/07/22 1323 16     Temp 05/07/22 1323 98.4 F (36.9 C)     Temp Source 05/07/22 1323 Oral     SpO2 05/07/22 1323 96 %     Weight --      Height --      Head Circumference --      Peak Flow --      Pain Score 05/07/22 1324 3     Pain Loc --      Pain Edu? --      Excl. in [place]? --    No data found.  Updated Vital Signs BP (!) 152/79 (BP Location: Right Arm)   Pulse 71   Temp 98.4 F (36.9 C) (Oral)   Resp 16   SpO2 96%   Visual Acuity Right Eye Distance:   Left Eye Distance:   Bilateral Distance:    Right Eye Near:   Left Eye Near:    Bilateral Near:     Physical Exam Vitals and nursing note reviewed.  Constitutional:      General: She is not in acute distress.    Appearance: She is well-developed.  HENT:     Head: Normocephalic.     Right Ear: Tympanic membrane, ear canal and external ear normal.     Left Ear: Tympanic membrane, ear canal and external ear normal.     Nose: Congestion present.  Right Turbinates: Enlarged and swollen.     Left Turbinates: Enlarged and swollen.     Right Sinus: Maxillary sinus tenderness present. No  frontal sinus tenderness.     Left Sinus: Maxillary sinus tenderness present. No frontal sinus tenderness.     Mouth/Throat:     Lips: Pink.     Mouth: Mucous membranes are moist.     Pharynx: Uvula midline. Posterior oropharyngeal erythema present. No pharyngeal swelling, oropharyngeal exudate or uvula swelling.     Tonsils: No tonsillar exudate or tonsillar abscesses. 0 on the right. 0 on the left.     Comments: Cobblestoning present on posterior tongue Eyes:     Extraocular Movements: Extraocular movements intact.     Pupils: Pupils are equal, round, and reactive to light.  Cardiovascular:     Rate and Rhythm: Regular rhythm.     Heart sounds: Normal heart sounds.  Pulmonary:     Effort: Pulmonary effort is normal. No respiratory distress.     Breath sounds: Normal breath sounds. No stridor. No wheezing, rhonchi or rales.  Abdominal:     General: Bowel sounds are normal.     Palpations: Abdomen is soft.     Tenderness: There is no abdominal tenderness.  Musculoskeletal:     Cervical back: Normal range of motion.  Lymphadenopathy:     Cervical: No cervical adenopathy.  Skin:    General: Skin is warm and dry.  Neurological:     General: No focal deficit present.     Mental Status: She is alert and oriented to person, place, and time.     GCS: GCS eye subscore is 4. GCS verbal subscore is 5. GCS motor subscore is 6.  Psychiatric:        Mood and Affect: Mood normal.        Speech: Speech normal.        Behavior: Behavior normal.      UC Treatments / Results  Labs (all labs ordered are listed, but only abnormal results are displayed) Labs Reviewed - No data to display  EKG   Radiology No results found.  Procedures Procedures (including critical care time)  Medications Ordered in UC Medications - No data to display  Initial Impression / Assessment and Plan / UC Course  I have reviewed the triage vital signs and the nursing notes.  Pertinent labs & imaging  results that were available during my care of the patient were reviewed by me and considered in my medical decision making (see chart for details).  Patient presents for a 6-day history of headache, nasal congestion, facial swelling, and facial pain.  On exam, patient has moderate maxillary sinus tenderness.  She also has swelling around the still septum.  Given the patient's presentation, symptoms are consistent with acute maxillary sinusitis.  Per the patient's request, we will start her on azithromycin and fluticasone.  Patient was advised that if symptoms do not improve with this antibiotic, this is not the first-line treatment, will recommend that she follow-up with her PCP at that time.  Supportive care recommendations were provided to the patient along with strict indication for follow-up.  Patient verbalized understanding.  All questions were answered. Final Clinical Impressions(s) / UC Diagnoses   Final diagnoses:  Acute maxillary sinusitis, recurrence not specified     Discharge Instructions      Take medication as directed. Consider taking an over-the-counter antihistamine daily while symptoms persist and for prophylaxis. Increase fluids and get plenty of rest. May  continue over-the-counter ibuprofen or Tylenol as needed for pain, fever, or general discomfort. Recommend normal saline nasal spray to help with nasal congestion throughout the day. If your symptoms fail to improve within the next 7 to 10 days, please follow-up with your primary care physician for further evaluation.     ED Prescriptions     Medication Sig Dispense Auth. Provider   azithromycin (ZITHROMAX) 250 MG tablet Take 1 tablet (250 mg total) by mouth daily. Take first 2 tablets together, then 1 every day until finished. 6 tablet Glee Lashomb-Warren, Alda Lea, NP   fluticasone (FLONASE) 50 MCG/ACT nasal spray Place 2 sprays into both nostrils daily. 16 g Kattleya Kuhnert-Warren, Alda Lea, NP      PDMP not reviewed this  encounter.   Tish Men, NP 05/07/22 1347

## 2022-05-07 NOTE — Discharge Instructions (Signed)
Take medication as directed. Consider taking an over-the-counter antihistamine daily while symptoms persist and for prophylaxis. Increase fluids and get plenty of rest. May continue over-the-counter ibuprofen or Tylenol as needed for pain, fever, or general discomfort. Recommend normal saline nasal spray to help with nasal congestion throughout the day. If your symptoms fail to improve within the next 7 to 10 days, please follow-up with your primary care physician for further evaluation.

## 2022-05-22 DIAGNOSIS — H43391 Other vitreous opacities, right eye: Secondary | ICD-10-CM | POA: Diagnosis not present

## 2022-07-03 DIAGNOSIS — H43391 Other vitreous opacities, right eye: Secondary | ICD-10-CM | POA: Diagnosis not present

## 2022-07-14 ENCOUNTER — Ambulatory Visit (INDEPENDENT_AMBULATORY_CARE_PROVIDER_SITE_OTHER): Payer: BC Managed Care – PPO | Admitting: Family Medicine

## 2022-07-14 VITALS — BP 136/68 | HR 74 | Temp 97.5°F | Ht 60.0 in | Wt 117.0 lb

## 2022-07-14 DIAGNOSIS — I10 Essential (primary) hypertension: Secondary | ICD-10-CM | POA: Diagnosis not present

## 2022-07-14 DIAGNOSIS — E785 Hyperlipidemia, unspecified: Secondary | ICD-10-CM

## 2022-07-14 DIAGNOSIS — Z720 Tobacco use: Secondary | ICD-10-CM | POA: Diagnosis not present

## 2022-07-14 MED ORDER — SERTRALINE HCL 25 MG PO TABS: 25.0000 mg | ORAL_TABLET | Freq: Every day | ORAL | 1 refills | Status: DC

## 2022-07-14 MED ORDER — LORAZEPAM 0.5 MG PO TABS: 0.5000 mg | ORAL_TABLET | Freq: Two times a day (BID) | ORAL | 1 refills | Status: DC | PRN

## 2022-07-14 MED ORDER — LISINOPRIL 20 MG PO TABS: 20.0000 mg | ORAL_TABLET | Freq: Every day | ORAL | 1 refills | Status: DC

## 2022-07-14 MED ORDER — ESOMEPRAZOLE MAGNESIUM 40 MG PO CPDR: DELAYED_RELEASE_CAPSULE | ORAL | 1 refills | Status: DC

## 2022-07-14 MED ORDER — PRAVASTATIN SODIUM 20 MG PO TABS: 20.0000 mg | ORAL_TABLET | Freq: Every day | ORAL | 1 refills | Status: DC

## 2022-07-14 NOTE — Patient Instructions (Signed)
Please do your lab work in late February  Send me an update if any problems with your cholesterol medicine  Melinda Chapman  We will see you in 6 months [facility]

## 2022-07-14 NOTE — Progress Notes (Signed)
   Subjective:    Patient ID: Melinda Chapman, female    DOB: July 13, 1966, 56 y.o.   MRN: [id number]  Hypertension This is a chronic problem. Treatments tried: lisinopril.  Patient has hyperlipidemia Not taking her medicines states they cause her to have headaches After discussion she is willing to try 1 other statin to see if things will go better  Still smokes about a pack a day we did discuss her trying to quit if she cannot quit at least cut down to half pack a day She is recently retired part-time she needs to fit in walking on a regular basis She tries to eat relatively healthy States her stress levels are reasonable  Review of Systems     Objective:   Physical Exam General-in no acute distress Eyes-no discharge Lungs-respiratory rate normal, CTA CV-no murmurs,RRR Extremities skin warm dry no edema Neuro grossly normal Behavior normal, alert        Assessment & Plan:  1. Essential hypertension, benign Decent blood pressure when she came in on recheck it was a little bit elevated but she stated that she was stressed healthy diet recommended, regular walking recommended - Lipid Panel - Hepatic Function Panel - Basic Metabolic Panel  2. Hyperlipidemia, unspecified hyperlipidemia type She would be willing to try pravastatin to check her cholesterol in 10 to 12 weeks if she has any side effects she will let us know - Lipid Panel - Hepatic Function Panel - Basic Metabolic Panel  3. Tobacco abuse She was encouraged to try to quit unlikely she will do so but she was encouraged to at least cut down to half pack per day she is due for lung cancer screening in mid December  To do lab work in March to follow-up office visit 6 months

## 2022-07-15 NOTE — Addendum Note (Signed)
Addended by: Vicente Males on: 07/15/2022 10:10 AM   Modules accepted: Orders

## 2022-07-15 NOTE — Progress Notes (Signed)
CT lung cancer screening order placed.

## 2022-07-22 ENCOUNTER — Ambulatory Visit: Payer: Self-pay | Admitting: Family Medicine

## 2022-08-20 NOTE — Progress Notes (Signed)
Called patient regarding follow-up LDCT. Patient is interested in follow-up but states that due to retirement recently, she is unsure of what her insurance is currently and is awaiting her card in the mail. She requests a call back at a later date.

## 2022-09-29 ENCOUNTER — Other Ambulatory Visit: Payer: Self-pay

## 2022-09-29 DIAGNOSIS — Z122 Encounter for screening for malignant neoplasm of respiratory organs: Secondary | ICD-10-CM

## 2022-09-29 DIAGNOSIS — Z87891 Personal history of nicotine dependence: Secondary | ICD-10-CM

## 2022-09-29 NOTE — Progress Notes (Signed)
LDCT order placed per protocol. Patient scheduled for 04/05 at [place]

## 2022-09-29 NOTE — Progress Notes (Signed)
Called patient regarding follow-up LDCT. Unable to reach patient directly. Detailed VM left asking that the patient return my call.

## 2022-10-15 ENCOUNTER — Encounter: Payer: Self-pay | Admitting: Radiology

## 2022-11-20 ENCOUNTER — Ambulatory Visit (HOSPITAL_COMMUNITY)
Admission: RE | Admit: 2022-11-20 | Discharge: 2022-11-20 | Disposition: A | Discharge status: Home / Self Care | Payer: BC Managed Care – PPO | Source: Ambulatory Visit | Attending: Physician Assistant | Admitting: Physician Assistant

## 2022-11-20 DIAGNOSIS — Z87891 Personal history of nicotine dependence: Secondary | ICD-10-CM | POA: Diagnosis present

## 2022-11-20 DIAGNOSIS — Z122 Encounter for screening for malignant neoplasm of respiratory organs: Secondary | ICD-10-CM | POA: Diagnosis present

## 2022-11-30 ENCOUNTER — Other Ambulatory Visit: Payer: Self-pay | Admitting: Family Medicine

## 2022-12-02 NOTE — Progress Notes (Signed)
Patient notified of LDCT Lung Cancer Screening Results via mail with the recommendation to follow-up in 12 months. Patient's referring provider has been sent a copy of results. Results are as follows:  IMPRESSION: 1. Lung-RADS 2, benign appearance or behavior. Continue annual screening with low-dose chest CT without contrast in 12 months. 2. Aortic Atherosclerosis (ICD10-I70.0) and Emphysema (ICD10-J43.9). 

## 2023-01-12 ENCOUNTER — Ambulatory Visit: Payer: BC Managed Care – PPO | Admitting: Family Medicine

## 2023-02-13 ENCOUNTER — Other Ambulatory Visit: Payer: Self-pay | Admitting: Family Medicine

## 2023-02-23 ENCOUNTER — Ambulatory Visit: Payer: BC Managed Care – PPO | Admitting: Family Medicine

## 2023-02-23 VITALS — BP 110/72 | HR 94 | Wt 109.0 lb

## 2023-02-23 DIAGNOSIS — E785 Hyperlipidemia, unspecified: Secondary | ICD-10-CM

## 2023-02-23 DIAGNOSIS — I700 Atherosclerosis of aorta: Secondary | ICD-10-CM | POA: Diagnosis not present

## 2023-02-23 DIAGNOSIS — Z720 Tobacco use: Secondary | ICD-10-CM

## 2023-02-23 DIAGNOSIS — I10 Essential (primary) hypertension: Secondary | ICD-10-CM | POA: Diagnosis not present

## 2023-02-23 DIAGNOSIS — R42 Dizziness and giddiness: Secondary | ICD-10-CM

## 2023-02-23 DIAGNOSIS — J439 Emphysema, unspecified: Secondary | ICD-10-CM

## 2023-02-23 DIAGNOSIS — Z1211 Encounter for screening for malignant neoplasm of colon: Secondary | ICD-10-CM

## 2023-02-23 DIAGNOSIS — L989 Disorder of the skin and subcutaneous tissue, unspecified: Secondary | ICD-10-CM

## 2023-02-23 NOTE — Progress Notes (Signed)
   Subjective:    Patient ID: Melinda Chapman, female    DOB: 02-Feb-1966, 57 y.o.   MRN: [id number]  HPI Patient arrives for  6 month follow up. Patient states no concerns or issues today. She has been doing well with healthy eating She still smokes She does not have plans to quit She knows she should She also has atherosclerosis which was shown on recent scan In addition to this she is concerned about the possibility of heart disease as well as carotid artery disease She understands she needs to get a mammogram She defers on a colonoscopy but is open to doing an Cologuard test no family history of colon cancer in  Essential hypertension, benign - Plan: Microalbumin / creatinine urine ratio, Basic Metabolic Panel (7)  Hyperlipidemia, unspecified hyperlipidemia type - Plan: CT CARDIAC SCORING (SELF PAY ONLY), Lipid panel  Tobacco abuse  Aortic atherosclerosis (HCC) - Plan: US Carotid Bilateral  Dizziness - Plan: US Carotid Bilateral  Skin lesion - Plan: Ambulatory referral to Dermatology  Screening for colon cancer - Plan: Cologuard  She does relate intermittent dizziness but denies balance issues just states she feels off balance at times and feels dizzy at times more so when she is sitting sometimes with activity  Does have a history of aortic atherosclerosis she did not take statins because she was fearful of the statins we did discuss risk and benefits again  She does have a dark area on the end of her nose that she is concerned about   Review of Systems     Objective:   Physical Exam  General-in no acute distress Eyes-no discharge Lungs-respiratory rate normal, CTA CV-no murmurs,RRR Extremities skin warm dry no edema Neuro grossly normal Behavior normal, alert Darkened area on the end of her nose with irregular border on 1 side reasonable to get this checked by dermatology      Assessment & Plan:  Very nice patient We are doing the best to help her 1.  Essential hypertension, benign Continue current medication blood pressure reasonable When I checked it was 110/72 - Microalbumin / creatinine urine ratio - Basic Metabolic Panel (7)  2. Hyperlipidemia, unspecified hyperlipidemia type We will check a CT cardiac scoring test if it is positive she really needs to be on a statin for now she does not want to be on 1 - CT CARDIAC SCORING (SELF PAY ONLY) - Lipid panel  3. Tobacco abuse She was counseled to quit smoking she is at risk of developing significant progressive issues with her lungs unfortunately she is just not motivated enough to quit She does have emphysema but not causing shortness of breath currently 4. Aortic atherosclerosis (HCC) Given her dizziness I would recommend a carotid ultrasound also recommend statin for her atherosclerosis but she defers - US Carotid Bilateral  5. Dizziness Ultrasound ordered await results - US Carotid Bilateral  6. Skin lesion Referral to dermatology - Ambulatory referral to Dermatology  7. Screening for colon cancer Cologuard - Cologuard  Follow-up in approximately 4 to 5 months

## 2023-02-27 LAB — BASIC METABOLIC PANEL (7)
BUN/Creatinine Ratio: 11 (ref 9–23)
BUN: 6 mg/dL (ref 6–24)
CO2: 24 mmol/L (ref 20–29)
Chloride: 95 mmol/L — ABNORMAL LOW (ref 96–106)
Creatinine, Ser: 0.56 mg/dL — ABNORMAL LOW (ref 0.57–1.00)
Glucose: 98 mg/dL (ref 70–99)
Potassium: 5.2 mmol/L (ref 3.5–5.2)
Sodium: 134 mmol/L (ref 134–144)
eGFR: 106 mL/min/{1.73_m2} (ref >59)

## 2023-02-27 LAB — LIPID PANEL
Chol/HDL Ratio: 2.8 ratio (ref 0.0–4.4)
Cholesterol, Total: 210 mg/dL — ABNORMAL HIGH (ref 100–199)
HDL: 75 mg/dL (ref >39)
LDL Chol Calc (NIH): 117 mg/dL — ABNORMAL HIGH (ref 0–99)
Triglycerides: 102 mg/dL (ref 0–149)
VLDL Cholesterol Cal: 18 mg/dL (ref 5–40)

## 2023-02-27 LAB — MICROALBUMIN / CREATININE URINE RATIO
Creatinine, Urine: 43.7 mg/dL
Microalb/Creat Ratio: <7 mg/g creat (ref 0–29)
Microalbumin, Urine: <3 ug/mL

## 2023-03-03 ENCOUNTER — Encounter: Payer: Self-pay | Admitting: Family Medicine

## 2023-03-03 DIAGNOSIS — E785 Hyperlipidemia, unspecified: Secondary | ICD-10-CM

## 2023-03-03 NOTE — Telephone Encounter (Signed)
Nurses I suspect that the cost that is being quoted to her is for both test together The cardiac scan test to look at the heart is only $100.  I suspect it is the carotid ultrasound that is higher because If I was her I would cancel the carotid study but still do the heart study-$100 for the heart study is below the actual cost of the test if they were charging full price.  She should call back and clarify.  The heart test will give good information that could potentially save her from a heart attack  Even if she has to reschedule the test due to her schedule she should still strongly consider doing the heart test because it is only $100 out-of-pocket-I realize $100 is a lot of money but in the grand scheme of things it is a small price to pay to potentially avert a serious issue  As for the carotid study I backed her 100% not to get it due to the high out-of-pocket cost-unfortunately there is nothing I can do about the higher cost of her carotid test  Feel free to share this message with Chance hopefully she will see that doing the heart test is a good idea at a low cost  (Obviously if she is canceling both please assist her in doing so or if she needs to reschedule the heart test assist.  Thank you)

## 2023-03-04 ENCOUNTER — Ambulatory Visit (HOSPITAL_COMMUNITY): Payer: BC Managed Care – PPO

## 2023-03-04 ENCOUNTER — Encounter: Payer: Self-pay | Admitting: Family Medicine

## 2023-03-04 ENCOUNTER — Other Ambulatory Visit (HOSPITAL_COMMUNITY): Payer: BC Managed Care – PPO

## 2023-03-04 ENCOUNTER — Encounter (HOSPITAL_COMMUNITY): Payer: Self-pay

## 2023-03-04 NOTE — Telephone Encounter (Signed)
Spoke with patient and she agreed to go ahead with the CT cardiac scoring. Patient stated she need referral for dermatology that the dermatologist that she has been referred to is not taken patient unless you have skin cancer. She prefers a [employer] operational [employer] that is not in [place].

## 2023-04-26 ENCOUNTER — Ambulatory Visit (HOSPITAL_COMMUNITY)
Admission: RE | Admit: 2023-04-26 | Discharge: 2023-04-26 | Disposition: A | Discharge status: Home / Self Care | Payer: BC Managed Care – PPO | Source: Ambulatory Visit | Attending: Family Medicine | Admitting: Family Medicine

## 2023-04-26 DIAGNOSIS — E785 Hyperlipidemia, unspecified: Secondary | ICD-10-CM | POA: Insufficient documentation

## 2023-06-17 ENCOUNTER — Ambulatory Visit: Payer: Self-pay

## 2023-06-17 ENCOUNTER — Ambulatory Visit
Admission: EM | Admit: 2023-06-17 | Discharge: 2023-06-17 | Disposition: A | Discharge status: Home / Self Care | Payer: BC Managed Care – PPO | Attending: Nurse Practitioner | Admitting: Nurse Practitioner

## 2023-06-17 DIAGNOSIS — J069 Acute upper respiratory infection, unspecified: Secondary | ICD-10-CM

## 2023-06-17 DIAGNOSIS — B9689 Other specified bacterial agents as the cause of diseases classified elsewhere: Secondary | ICD-10-CM | POA: Diagnosis not present

## 2023-06-17 MED ORDER — PSEUDOEPH-BROMPHEN-DM 30-2-10 MG/5ML PO SYRP: 5.0000 mL | ORAL_SOLUTION | Freq: Four times a day (QID) | ORAL | 0 refills | Status: DC | PRN

## 2023-06-17 MED ORDER — AZITHROMYCIN 250 MG PO TABS: 250.0000 mg | ORAL_TABLET | Freq: Every day | ORAL | 0 refills | Status: DC

## 2023-06-17 MED ORDER — FLUTICASONE PROPIONATE 50 MCG/ACT NA SUSP: 2.0000 | Freq: Every day | NASAL | 0 refills | Status: DC

## 2023-06-17 NOTE — Discharge Instructions (Signed)
Take medication as prescribed. Increase fluids and allow for plenty of rest. May take over-the-counter Tylenol as needed for pain, fever, general discomfort. Warm salt water gargles as needed for throat pain or discomfort. Recommend normal saline nasal spray throughout the day to help with nasal congestion and runny nose. For the cough, recommend throat lozenges or cough drops. Continue use of your vaporizer, or you may use a humidifier in your bedroom at nighttime during sleep. As discussed, if symptoms do not improve with this treatment, you may follow-up in this clinic or with your primary care physician for further evaluation. Follow-up as needed.

## 2023-06-17 NOTE — ED Provider Notes (Signed)
[facility]    CSN: [id number] Arrival date & time: 06/17/23  1347      History   Chief Complaint No chief complaint on file.   HPI Melinda Chapman is a 57 y.o. female.   The history is provided by the patient.   Patient presents for a 3-week history of nasal congestion, runny nose cough, and scratchy throat.  Patient denies fever, chills, headache, ear pain, wheezing, difficulty breathing, chest pain, abdominal pain, nausea, vomiting, diarrhea, or rash.  Patient reports she has been taking Alka-Seltzer, using vitamins, and using a vaporizer at home with minimal relief of her symptoms.  She states several of her family members have been sick with the same or similar symptoms, but they have since recovered.  Past Medical History:  Diagnosis Date   Anxiety    Aortic atherosclerosis (HCC) 04/21/2020   Seen on CAT scan for lung cancer screening August 2021   Constipation    Diabetes in pregnancy    Diarrhea    GERD (gastroesophageal reflux disease)    Hyperlipidemia 03/05/2020   Hypertension    IBS (irritable bowel syndrome)    Papanicolaou smear of cervix with positive high risk human papilloma virus (HPV) test 08/12/2017    Patient Active Problem List   Diagnosis Date Noted   Pulmonary emphysema (HCC) 02/23/2023   GERD (gastroesophageal reflux disease) 01/16/2022   Stress 12/18/2020   Aortic atherosclerosis (HCC) 04/21/2020   Encounter for screening fecal occult blood testing 03/26/2020   Tobacco abuse 03/22/2020   Hyperlipidemia 03/05/2020   Mild sleep apnea 08/25/2017   Papanicolaou smear of cervix with positive high risk human papilloma virus (HPV) test 08/12/2017   Encounter for gynecological examination with Papanicolaou smear of cervix 08/05/2017   Essential hypertension, benign 03/14/2013   Panic attack as reaction to stress 03/14/2013    Past Surgical History:  Procedure Laterality Date   APPENDECTOMY  1983   CESAREAN SECTION  1994, 2002   x 2    CHOLECYSTECTOMY  2000   in [place]   ENDOMETRIAL ABLATION  2011   TUBAL LIGATION  2002    OB History     Gravida  2   Para  2   Term  2   Preterm      AB      Living  2      SAB      IAB      Ectopic      Multiple      Live Births  2            Home Medications    Prior to Admission medications   Medication Sig Start Date End Date Taking? Authorizing Provider  azithromycin (ZITHROMAX) 250 MG tablet Take 1 tablet (250 mg total) by mouth daily. Take first 2 tablets together, then 1 every day until finished. 06/17/23  Yes Leath-Warren, Sadie Haber, NP  brompheniramine-pseudoephedrine-DM 30-2-10 MG/5ML syrup Take 5 mLs by mouth 4 (four) times daily as needed. 06/17/23  Yes Leath-Warren, Sadie Haber, NP  fluticasone (FLONASE) 50 MCG/ACT nasal spray Place 2 sprays into both nostrils daily. 06/17/23  Yes Leath-Warren, Sadie Haber, NP  esomeprazole (NEXIUM) 40 MG capsule TAKE (1) CAPSULE BY MOUTH DAILY. 07/14/22   Babs Sciara, MD  lisinopril (ZESTRIL) 20 MG tablet TAKE ONE TABLET BY MOUTH ONCE DAILY 02/15/23   Babs Sciara, MD  LORazepam (ATIVAN) 0.5 MG tablet TAKE ONE TABLET BY MOUTH bifd AS NEEDED FOR ANXIETY 11/30/22  Babs Sciara, MD  Multiple Vitamin (MULTIVITAMIN ADULT PO) Take by mouth.    [provider]  omeprazole (PRILOSEC) 20 MG capsule Take 20 mg by mouth daily.    [provider]  pravastatin (PRAVACHOL) 20 MG tablet Take 1 tablet (20 mg total) by mouth daily. 07/14/22   Babs Sciara, MD  sertraline (ZOLOFT) 25 MG tablet TAKE ONE TABLET BY MOUTH ONCE DAILY 02/15/23   Babs Sciara, MD    Family History Family History  Problem Relation Age of Onset   Colon cancer Paternal Grandmother    Hypertension Mother    Hyperlipidemia Mother    Colon polyps Father    Colon polyps Brother    Colon polyps Paternal Aunt    Esophageal cancer Neg Hx    Stomach cancer Neg Hx     Social History Social History   Tobacco Use    Smoking status: Every Day    Current packs/day: 0.50    Average packs/day: 0.5 packs/day for 30.0 years (15.0 ttl pk-yrs)    Types: Cigarettes   Smokeless tobacco: Never  Vaping Use   Vaping status: Never Used  Substance Use Topics   Alcohol use: Yes    Alcohol/week: 5.0 standard drinks of alcohol    Types: 5 Cans of beer per week    Comment: occasional   Drug use: No     Allergies   Crestor [rosuvastatin]   Review of Systems Review of Systems Per HPI  Physical Exam Triage Vital Signs ED Triage Vitals  Encounter Vitals Group     BP 06/17/23 1355 119/73     Systolic BP Percentile --      Diastolic BP Percentile --      Pulse Rate 06/17/23 1355 80     Resp 06/17/23 1355 18     Temp 06/17/23 1355 98.9 F (37.2 C)     Temp Source 06/17/23 1355 Oral     SpO2 06/17/23 1355 94 %     Weight --      Height --      Head Circumference --      Peak Flow --      Pain Score 06/17/23 1358 0     Pain Loc --      Pain Education --      Exclude from Growth Chart --    No data found.  Updated Vital Signs BP 119/73 (BP Location: Right Arm)   Pulse 80   Temp 98.9 F (37.2 C) (Oral)   Resp 18   SpO2 94%   Visual Acuity Right Eye Distance:   Left Eye Distance:   Bilateral Distance:    Right Eye Near:   Left Eye Near:    Bilateral Near:     Physical Exam Vitals and nursing note reviewed.  Constitutional:      General: She is not in acute distress.    Appearance: Normal appearance.  HENT:     Head: Normocephalic.     Right Ear: Tympanic membrane, ear canal and external ear normal.     Left Ear: Tympanic membrane, ear canal and external ear normal.     Nose: Congestion present.     Right Turbinates: Enlarged and swollen.     Left Turbinates: Enlarged and swollen.     Right Sinus: No maxillary sinus tenderness or frontal sinus tenderness.     Left Sinus: No maxillary sinus tenderness or frontal sinus tenderness.     Mouth/Throat:  Lips: Pink.     Mouth: Mucous  membranes are moist.     Pharynx: Uvula midline. Posterior oropharyngeal erythema and postnasal drip present. No pharyngeal swelling, oropharyngeal exudate or uvula swelling.     Comments: Cobblestoning present to posterior oropharynx  Eyes:     Extraocular Movements: Extraocular movements intact.     Conjunctiva/sclera: Conjunctivae normal.     Pupils: Pupils are equal, round, and reactive to light.  Cardiovascular:     Rate and Rhythm: Normal rate and regular rhythm.     Pulses: Normal pulses.     Heart sounds: Normal heart sounds.  Pulmonary:     Effort: Pulmonary effort is normal. No respiratory distress.     Breath sounds: Normal breath sounds. No stridor. No wheezing, rhonchi or rales.  Abdominal:     General: Bowel sounds are normal.     Palpations: Abdomen is soft.     Tenderness: There is no abdominal tenderness.  Musculoskeletal:     Cervical back: Normal range of motion.  Lymphadenopathy:     Cervical: No cervical adenopathy.  Skin:    General: Skin is warm and dry.  Neurological:     General: No focal deficit present.     Mental Status: She is alert and oriented to person, place, and time.  Psychiatric:        Mood and Affect: Mood normal.        Behavior: Behavior normal.      UC Treatments / Results  Labs (all labs ordered are listed, but only abnormal results are displayed) Labs Reviewed - No data to display  EKG   Radiology No results found.  Procedures Procedures (including critical care time)  Medications Ordered in UC Medications - No data to display  Initial Impression / Assessment and Plan / UC Course  I have reviewed the triage vital signs and the nursing notes.  Pertinent labs & imaging results that were available during my care of the patient were reviewed by me and considered in my medical decision making (see chart for details).  Symptoms consistent with acute upper respiratory infection.  Symptoms have been present for the past 3  weeks, given duration of symptoms, will treat for bacterial upper respiratory infection with azithromycin.  For patient's continued cough, Bromfed DM was prescribed, and for nasal congestion, fluticasone 50 mcg nasal spray was prescribed.  Supportive care recommendations were provided and discussed with the patient to include over-the-counter Tylenol, warm salt water gargles, normal saline nasal spray, and use of a humidifier to help with cough and nasal congestion.  Patient was given indications of when follow-up be necessary.  Patient is in agreement with this plan of care and verbalizes understanding.  All questions were answered.  Patient stable for discharge.  Final Clinical Impressions(s) / UC Diagnoses   Final diagnoses:  Bacterial upper respiratory infection     Discharge Instructions      Take medication as prescribed. Increase fluids and allow for plenty of rest. May take over-the-counter Tylenol as needed for pain, fever, general discomfort. Warm salt water gargles as needed for throat pain or discomfort. Recommend normal saline nasal spray throughout the day to help with nasal congestion and runny nose. For the cough, recommend throat lozenges or cough drops. Continue use of your vaporizer, or you may use a humidifier in your bedroom at nighttime during sleep. As discussed, if symptoms do not improve with this treatment, you may follow-up in this clinic or with your primary care  physician for further evaluation. Follow-up as needed.     ED Prescriptions     Medication Sig Dispense Auth. Provider   azithromycin (ZITHROMAX) 250 MG tablet Take 1 tablet (250 mg total) by mouth daily. Take first 2 tablets together, then 1 every day until finished. 6 tablet Leath-Warren, Sadie Haber, NP   brompheniramine-pseudoephedrine-DM 30-2-10 MG/5ML syrup Take 5 mLs by mouth 4 (four) times daily as needed. 140 mL Leath-Warren, Sadie Haber, NP   fluticasone (FLONASE) 50 MCG/ACT nasal spray Place  2 sprays into both nostrils daily. 16 g Leath-Warren, Sadie Haber, NP      PDMP not reviewed this encounter.   Abran Cantor, NP 06/17/23 1437

## 2023-06-17 NOTE — ED Triage Notes (Signed)
Pt reports she has nasal congestion, coughing, throat is itchy x 3 weeks  Took alkertrzer plus, vaporizer, and vitamins

## 2023-07-01 ENCOUNTER — Ambulatory Visit
Admission: RE | Admit: 2023-07-01 | Discharge: 2023-07-01 | Disposition: A | Discharge status: Home / Self Care | Payer: BC Managed Care – PPO | Source: Ambulatory Visit | Attending: Nurse Practitioner | Admitting: Nurse Practitioner

## 2023-07-01 VITALS — BP 160/86 | HR 63 | Temp 98.5°F | Resp 16

## 2023-07-01 DIAGNOSIS — R052 Subacute cough: Secondary | ICD-10-CM | POA: Diagnosis not present

## 2023-07-01 MED ORDER — BENZONATATE 100 MG PO CAPS: 100.0000 mg | ORAL_CAPSULE | Freq: Three times a day (TID) | ORAL | 0 refills | Status: DC | PRN

## 2023-07-01 MED ORDER — IPRATROPIUM-ALBUTEROL 0.5-2.5 (3) MG/3ML IN SOLN
1.5000 mL | Freq: Once | RESPIRATORY_TRACT | Status: AC
  Administered 2023-07-01: 1.5 mL via RESPIRATORY_TRACT

## 2023-07-01 NOTE — ED Provider Notes (Signed)
[facility]    CSN: [id number] Arrival date & time: 07/01/23  1348      History   Chief Complaint Chief Complaint  Patient presents with   Cough    Was there a couple of weeks ago and received an antibiotic. I am still congested and coughing alot. - Entered by patient    HPI Melinda Chapman is a 57 y.o. female.   Patient presents today for ongoing cough since the last time she was seen in urgent care on 06/17/23.  She was treated for an upper respiratory infection and treated with azithromycin and cough syrup which helped but did not fully resolve symptoms.  She denies fevers, body aches or chills, shortness of breath or chest pain, runny or stuffy nose, sore throat, headache, ear pain, abdominal pain, nausea/vomiting, or diarrhea.  Reports the cough is worse first in the morning after she has slept all night.  Reports that she is a current smoker, smokes less than 1 pack/day and has smoked more than 20 years.    Past Medical History:  Diagnosis Date   Anxiety    Aortic atherosclerosis (HCC) 04/21/2020   Seen on CAT scan for lung cancer screening August 2021   Constipation    Diabetes in pregnancy    Diarrhea    GERD (gastroesophageal reflux disease)    Hyperlipidemia 03/05/2020   Hypertension    IBS (irritable bowel syndrome)    Papanicolaou smear of cervix with positive high risk human papilloma virus (HPV) test 08/12/2017    Patient Active Problem List   Diagnosis Date Noted   Pulmonary emphysema (HCC) 02/23/2023   GERD (gastroesophageal reflux disease) 01/16/2022   Stress 12/18/2020   Aortic atherosclerosis (HCC) 04/21/2020   Encounter for screening fecal occult blood testing 03/26/2020   Tobacco abuse 03/22/2020   Hyperlipidemia 03/05/2020   Mild sleep apnea 08/25/2017   Papanicolaou smear of cervix with positive high risk human papilloma virus (HPV) test 08/12/2017   Encounter for gynecological examination with Papanicolaou smear of cervix 08/05/2017    Essential hypertension, benign 03/14/2013   Panic attack as reaction to stress 03/14/2013    Past Surgical History:  Procedure Laterality Date   APPENDECTOMY  1983   CESAREAN SECTION  1994, 2002   x 2   CHOLECYSTECTOMY  2000   in [place]   ENDOMETRIAL ABLATION  2011   TUBAL LIGATION  2002    OB History     Gravida  2   Para  2   Term  2   Preterm      AB      Living  2      SAB      IAB      Ectopic      Multiple      Live Births  2            Home Medications    Prior to Admission medications   Medication Sig Start Date End Date Taking? Authorizing Provider  benzonatate (TESSALON) 100 MG capsule Take 1 capsule (100 mg total) by mouth 3 (three) times daily as needed for cough. Do not take with alcohol or while driving or operating heavy machinery.  May cause drowsiness. 07/01/23  Yes Valentino Nose, NP  azithromycin (ZITHROMAX) 250 MG tablet Take 1 tablet (250 mg total) by mouth daily. Take first 2 tablets together, then 1 every day until finished. 06/17/23   Leath-Warren, Sadie Haber, NP  brompheniramine-pseudoephedrine-DM 30-2-10 MG/5ML syrup Take  5 mLs by mouth 4 (four) times daily as needed. 06/17/23   Leath-Warren, Sadie Haber, NP  esomeprazole (NEXIUM) 40 MG capsule TAKE (1) CAPSULE BY MOUTH DAILY. 07/14/22   Babs Sciara, MD  fluticasone (FLONASE) 50 MCG/ACT nasal spray Place 2 sprays into both nostrils daily. 06/17/23   Leath-Warren, Sadie Haber, NP  lisinopril (ZESTRIL) 20 MG tablet TAKE ONE TABLET BY MOUTH ONCE DAILY 02/15/23   Babs Sciara, MD  LORazepam (ATIVAN) 0.5 MG tablet TAKE ONE TABLET BY MOUTH bifd AS NEEDED FOR ANXIETY 11/30/22   Babs Sciara, MD  Multiple Vitamin (MULTIVITAMIN ADULT PO) Take by mouth.    [provider]  omeprazole (PRILOSEC) 20 MG capsule Take 20 mg by mouth daily.    [provider]  pravastatin (PRAVACHOL) 20 MG tablet Take 1 tablet (20 mg total) by mouth daily. 07/14/22   Babs Sciara, MD  sertraline (ZOLOFT) 25 MG tablet TAKE ONE TABLET BY MOUTH ONCE DAILY 02/15/23   Babs Sciara, MD    Family History Family History  Problem Relation Age of Onset   Colon cancer Paternal Grandmother    Hypertension Mother    Hyperlipidemia Mother    Colon polyps Father    Colon polyps Brother    Colon polyps Paternal Aunt    Esophageal cancer Neg Hx    Stomach cancer Neg Hx     Social History Social History   Tobacco Use   Smoking status: Every Day    Current packs/day: 0.50    Average packs/day: 0.5 packs/day for 30.0 years (15.0 ttl pk-yrs)    Types: Cigarettes   Smokeless tobacco: Never  Vaping Use   Vaping status: Never Used  Substance Use Topics   Alcohol use: Yes    Alcohol/week: 5.0 standard drinks of alcohol    Types: 5 Cans of beer per week    Comment: occasional   Drug use: No     Allergies   Crestor [rosuvastatin]   Review of Systems Review of Systems Per HPI  Physical Exam Triage Vital Signs ED Triage Vitals  Encounter Vitals Group     BP 07/01/23 1356 (!) 160/86     Systolic BP Percentile --      Diastolic BP Percentile --      Pulse Rate 07/01/23 1356 67     Resp 07/01/23 1356 16     Temp 07/01/23 1356 98.5 F (36.9 C)     Temp Source 07/01/23 1356 Oral     SpO2 07/01/23 1356 96 %     Weight --      Height --      Head Circumference --      Peak Flow --      Pain Score 07/01/23 1354 0     Pain Loc --      Pain Education --      Exclude from Growth Chart --    No data found.  Updated Vital Signs BP (!) 160/86 (BP Location: Right Arm)   Pulse 63   Temp 98.5 F (36.9 C) (Oral)   Resp 16   SpO2 98% Comment: after breathing treatment  Visual Acuity Right Eye Distance:   Left Eye Distance:   Bilateral Distance:    Right Eye Near:   Left Eye Near:    Bilateral Near:     Physical Exam Vitals and nursing note reviewed.  Constitutional:      General: She is not in acute distress.  Appearance: Normal appearance. She  is not ill-appearing or toxic-appearing.  HENT:     Head: Normocephalic and atraumatic.     Right Ear: Tympanic membrane, ear canal and external ear normal.     Left Ear: Tympanic membrane, ear canal and external ear normal.     Nose: No congestion or rhinorrhea.     Mouth/Throat:     Mouth: Mucous membranes are moist.     Pharynx: Oropharynx is clear. No oropharyngeal exudate or posterior oropharyngeal erythema.  Eyes:     General: No scleral icterus.    Extraocular Movements: Extraocular movements intact.  Cardiovascular:     Rate and Rhythm: Normal rate and regular rhythm.  Pulmonary:     Effort: Pulmonary effort is normal. No respiratory distress.     Breath sounds: Normal breath sounds. Decreased air movement present. No wheezing, rhonchi or rales.  Musculoskeletal:     Cervical back: Normal range of motion and neck supple.  Lymphadenopathy:     Cervical: No cervical adenopathy.  Skin:    General: Skin is warm and dry.     Coloration: Skin is not jaundiced or pale.     Findings: No erythema or rash.  Neurological:     Mental Status: She is alert and oriented to person, place, and time.  Psychiatric:        Behavior: Behavior is cooperative.      UC Treatments / Results  Labs (all labs ordered are listed, but only abnormal results are displayed) Labs Reviewed - No data to display  EKG   Radiology No results found.  Procedures Procedures (including critical care time)  Medications Ordered in UC Medications  ipratropium-albuterol (DUONEB) 0.5-2.5 (3) MG/3ML nebulizer solution 1.5 mL (1.5 mLs Nebulization Given 07/01/23 1435)    Initial Impression / Assessment and Plan / UC Course  I have reviewed the triage vital signs and the nursing notes.  Pertinent labs & imaging results that were available during my care of the patient were reviewed by me and considered in my medical decision making (see chart for details).   Patient is well-appearing, afebrile, not  tachycardic, not tachypneic, oxygenating well on room air.  Patient is mildly hypertensive in urgent care today.  1. Subacute cough Overall, vitals and exam are reassuring DuoNeb given, half dose per patient's request, with improvement in air movement to bilateral lung fields, no wheezing or rales/crackles appreciated, chest x-ray deferred Suspect postinfectious cough, continue to treat with cough suppressant medication Return precautions discussed with patient  The patient was given the opportunity to ask questions.  All questions answered to their satisfaction.  The patient is in agreement to this plan.    Final Clinical Impressions(s) / UC Diagnoses   Final diagnoses:  Subacute cough     Discharge Instructions      As we discussed, I suspect the cough is ongoing from the infection you had a couple of weeks  ago.  This can last for up to 6 weeks.  If you develop pain with breathing, shortness of breath, new fevers, please seek care.  Some things that can make you feel better are: - OTC guaifenesin (Mucinex) 600 mg twice daily for congestion - Saline sinus flushes or a neti pot - Humidifying the air -Tessalon Perles every 8 hours as needed for dry cough     ED Prescriptions     Medication Sig Dispense Auth. Provider   benzonatate (TESSALON) 100 MG capsule Take 1 capsule (100 mg total) by mouth  3 (three) times daily as needed for cough. Do not take with alcohol or while driving or operating heavy machinery.  May cause drowsiness. 21 capsule Valentino Nose, NP      PDMP not reviewed this encounter.   Valentino Nose, NP 07/01/23 1451

## 2023-07-01 NOTE — Discharge Instructions (Signed)
As we discussed, I suspect the cough is ongoing from the infection you had a couple of weeks  ago.  This can last for up to 6 weeks.  If you develop pain with breathing, shortness of breath, new fevers, please seek care.  Some things that can make you feel better are: - OTC guaifenesin (Mucinex) 600 mg twice daily for congestion - Saline sinus flushes or a neti pot - Humidifying the air -Tessalon Perles every 8 hours as needed for dry cough

## 2023-07-01 NOTE — ED Triage Notes (Addendum)
Pt presents to UC w/ c/o continued cough since last visit 2 weeks ago. Pt has been taking medications as prescribed, states she felt "better for a while" after finishing medications, then began to cough again. Denies fevers.

## 2023-08-26 ENCOUNTER — Encounter: Payer: Self-pay | Admitting: Family Medicine

## 2023-08-26 ENCOUNTER — Ambulatory Visit: Payer: 59 | Admitting: Family Medicine

## 2023-08-26 VITALS — BP 118/73 | HR 81 | Ht 60.0 in | Wt 108.0 lb

## 2023-08-26 DIAGNOSIS — E785 Hyperlipidemia, unspecified: Secondary | ICD-10-CM

## 2023-08-26 DIAGNOSIS — I700 Atherosclerosis of aorta: Secondary | ICD-10-CM

## 2023-08-26 DIAGNOSIS — J439 Emphysema, unspecified: Secondary | ICD-10-CM | POA: Diagnosis not present

## 2023-08-26 DIAGNOSIS — I10 Essential (primary) hypertension: Secondary | ICD-10-CM

## 2023-08-26 DIAGNOSIS — F17200 Nicotine dependence, unspecified, uncomplicated: Secondary | ICD-10-CM

## 2023-08-26 MED ORDER — SERTRALINE HCL 25 MG PO TABS: 25.0000 mg | ORAL_TABLET | Freq: Every day | ORAL | 1 refills | Status: DC

## 2023-08-26 MED ORDER — ESOMEPRAZOLE MAGNESIUM 40 MG PO CPDR: DELAYED_RELEASE_CAPSULE | ORAL | 1 refills | Status: DC

## 2023-08-26 MED ORDER — LISINOPRIL 20 MG PO TABS: 20.0000 mg | ORAL_TABLET | Freq: Every day | ORAL | 1 refills | Status: DC

## 2023-08-26 MED ORDER — LORAZEPAM 0.5 MG PO TABS: 0.5000 mg | ORAL_TABLET | Freq: Four times a day (QID) | ORAL | 1 refills | Status: DC | PRN

## 2023-08-26 NOTE — Progress Notes (Signed)
   Subjective:    Patient ID: Melinda Chapman, female    DOB: 11/30/1965, 58 y.o.   MRN: [id number]  HPI Very nice patient Does have fairly frequent reflux related issues but Nexium  helps she utilizes it every other day She does take her blood pressure medicine Patient for blood pressure check up.  The patient does have hypertension.   Patient relates dietary measures try to minimize salt The importance of healthy diet and activity were discussed Patient relates compliance  Her moods overall are stable Zoloft  does help She uses lorazepam  sparingly for anxiety denies drowsiness with it Patient does smoke she knows she needs to quit she has minimal plans to do so but she will try to cut back Counseling was given Patient also has emphysema as well as aortic atherosclerosis seen on previous scan but also had a negative coronary calcium  Patient not interested in aggressive measures but will be willing to retry pravastatin     Review of Systems     Objective:   Physical Exam General-in no acute distress Eyes-no discharge Lungs-respiratory rate normal, CTA CV-no murmurs,RRR Extremities skin warm dry no edema Neuro grossly normal Behavior normal, alert        Assessment & Plan:  1. Aortic atherosclerosis (HCC) (Primary) Very important to get LDL below 70.  Recommend statin.  Patient currently would like to go dietary measures  2. Essential hypertension, benign Blood pressure decent control continue current measures healthy diet  3. Hyperlipidemia, unspecified hyperlipidemia type Will follow closely and repeat labs again in the summer before her follow-up visit if this shows progression definitely encourage statin  4. Pulmonary emphysema, unspecified emphysema type (HCC) Strongly encourage patient to quit smoking she is asymptomatic currently  5. Smoker Encourage patient to quit smoking.

## 2023-08-31 ENCOUNTER — Other Ambulatory Visit: Payer: Self-pay

## 2023-08-31 DIAGNOSIS — I10 Essential (primary) hypertension: Secondary | ICD-10-CM

## 2023-08-31 DIAGNOSIS — E785 Hyperlipidemia, unspecified: Secondary | ICD-10-CM

## 2023-09-20 ENCOUNTER — Other Ambulatory Visit: Payer: Self-pay

## 2023-09-20 DIAGNOSIS — Z122 Encounter for screening for malignant neoplasm of respiratory organs: Secondary | ICD-10-CM

## 2023-09-20 DIAGNOSIS — Z87891 Personal history of nicotine dependence: Secondary | ICD-10-CM

## 2023-11-23 ENCOUNTER — Ambulatory Visit (HOSPITAL_COMMUNITY)
Admission: RE | Admit: 2023-11-23 | Discharge: 2023-11-23 | Disposition: A | Discharge status: Home / Self Care | Payer: 59 | Source: Ambulatory Visit | Attending: Oncology | Admitting: Oncology

## 2023-11-23 DIAGNOSIS — Z87891 Personal history of nicotine dependence: Secondary | ICD-10-CM | POA: Insufficient documentation

## 2023-11-23 DIAGNOSIS — Z122 Encounter for screening for malignant neoplasm of respiratory organs: Secondary | ICD-10-CM | POA: Insufficient documentation

## 2023-12-21 ENCOUNTER — Encounter: Payer: Self-pay | Admitting: Family Medicine

## 2023-12-21 NOTE — Telephone Encounter (Signed)
 Nurses In regards to why the exam is not been read-there is a Librarian, academic health.  As a result they are behind on reading these type of test.  Typically it is read within a month's time.  Obviously they are running behind.  Please call radiology department in asked that this test be read within the next 7 days so we can have some results-it seems to me 1 month is long enough to wait.  If Melinda Chapman has not heard anything regarding the test results within 2 weeks please notify us  again and we will reconnect with radiology  Melinda Chapman has a follow-up visit with us  this summer please keep thanks-Dr. Geralyn Knee

## 2023-12-28 ENCOUNTER — Other Ambulatory Visit: Payer: Self-pay | Admitting: Family Medicine

## 2024-02-11 ENCOUNTER — Encounter: Payer: Self-pay | Admitting: *Deleted

## 2024-02-14 ENCOUNTER — Encounter: Payer: Self-pay | Admitting: *Deleted

## 2024-02-14 NOTE — Progress Notes (Signed)
Patient notified via mail of LDCT l ung cancer screening results with recommendations to follow up in 12 months.  Also notified of incidental findings and need to follow up with PCP.  Patient's referring provider was sent a copy of results.    IMPRESSION: Lung-RADS 2, benign appearance or behavior. Continue annual screening with low-dose chest CT without contrast in 12 months.  Aortic Atherosclerosis (ICD10-I70.0) and Emphysema (ICD10-J43.9).   

## 2024-02-23 ENCOUNTER — Ambulatory Visit: Payer: 59 | Admitting: Family Medicine

## 2024-02-23 ENCOUNTER — Encounter: Payer: Self-pay | Admitting: Family Medicine

## 2024-02-23 VITALS — BP 129/75 | HR 71 | Temp 97.3°F | Ht 60.0 in | Wt 107.0 lb

## 2024-02-23 DIAGNOSIS — B351 Tinea unguium: Secondary | ICD-10-CM | POA: Diagnosis not present

## 2024-02-23 DIAGNOSIS — J439 Emphysema, unspecified: Secondary | ICD-10-CM

## 2024-02-23 DIAGNOSIS — E785 Hyperlipidemia, unspecified: Secondary | ICD-10-CM

## 2024-02-23 DIAGNOSIS — Z720 Tobacco use: Secondary | ICD-10-CM

## 2024-02-23 DIAGNOSIS — R0683 Snoring: Secondary | ICD-10-CM

## 2024-02-23 DIAGNOSIS — I700 Atherosclerosis of aorta: Secondary | ICD-10-CM | POA: Diagnosis not present

## 2024-02-23 DIAGNOSIS — I10 Essential (primary) hypertension: Secondary | ICD-10-CM | POA: Diagnosis not present

## 2024-02-23 MED ORDER — LORAZEPAM 0.5 MG PO TABS: 0.5000 mg | ORAL_TABLET | Freq: Four times a day (QID) | ORAL | 3 refills | Status: AC | PRN

## 2024-02-23 MED ORDER — SERTRALINE HCL 25 MG PO TABS: 25.0000 mg | ORAL_TABLET | Freq: Every day | ORAL | 1 refills | Status: AC

## 2024-02-23 MED ORDER — ESOMEPRAZOLE MAGNESIUM 40 MG PO CPDR: DELAYED_RELEASE_CAPSULE | ORAL | 1 refills | Status: AC

## 2024-02-23 MED ORDER — LISINOPRIL 20 MG PO TABS: 20.0000 mg | ORAL_TABLET | Freq: Every day | ORAL | 1 refills | Status: AC

## 2024-02-23 NOTE — Progress Notes (Signed)
 Subjective:    Patient ID: Melinda Chapman, female    DOB: [birth year], 58 y.o.   MRN: [id number]  HPI Discussed the use of AI scribe software for clinical note transcription with the patient, who gave verbal consent to proceed.  History of Present Illness   The patient presents with breathing difficulties and toenail fungus.  Breathing difficulties occur primarily on very hot and humid days, making it challenging to breathe. They find it easier to perform outdoor activities in the early morning to avoid the heavy air later in the day. There is a history of mild sleep apnea diagnosed in 2019, with occasional waking up feeling 'a little gaspy' and snoring, although they do not sleep with anyone.  They have been experiencing toenail fungus, which has also affected their fingernails. Over-the-counter treatments have improved the condition, but the fungus has been persistent despite treatment. They are interested in stronger prescription options.  They smoke approximately a pack of cigarettes per day and have not yet found the motivation to quit. Their stress levels are reasonable, and they sleep fairly well at night. They have no particular worries or concerns about their current health status.        Review of Systems     Objective:   Physical Exam General-in no acute distress Eyes-no discharge Lungs-respiratory rate normal, CTA CV-no murmurs,RRR Extremities skin warm dry no edema Neuro grossly normal Behavior normal, alert        Assessment & Plan:  Assessment and Plan    Sleep Apnea Mild sleep apnea diagnosed in 2019. Prefers home sleep study. - Arrange home sleep study to reassess sleep apnea.  Chronic Obstructive Pulmonary Disease (COPD) Chronic lung changes consistent with emphysema. Slight increased risk of pneumococcal pneumonia. - Encourage consideration of pneumococcal vaccine.  Tobacco Use Disorder Smokes approximately one pack per day. Discussed benefits of  reducing smoking. - Encourage reduction of smoking to three-fourths or half a pack per day.  Atherosclerosis Mild atherosclerosis with slight increased risk of myocardial infarction and cerebrovascular accident. - Order blood work to monitor cholesterol and other relevant markers.  Onychomycosis Toenail fungus with previous improvement using over-the-counter treatment. Declined oral antifungal due to potential hepatotoxicity. - Investigate compounded topical antifungal solutions and provide cost information via MyChart. - Consider compounded topical antifungal if cost is reasonable.  General Health Maintenance Has not had a mammogram in several years. Discussed benefits of biennial mammograms. - Provide phone number for scheduling mammogram. - Encourage biennial mammograms. - Encourage consideration of pneumococcal vaccine.  Follow-up Routine follow-up and monitoring of health conditions discussed. - Follow up in six months or sooner if needed. - Order blood work including urine test for protein. - Send in refills for acid blocker medication. - Provide lab orders for completion at their convenience, preferably in the morning.      1. Essential hypertension, benign (Primary) Blood pressure decent control continue current measures - Basic Metabolic Panel - Microalbumin/Creatinine Ratio, Urine  2. Hyperlipidemia, unspecified hyperlipidemia type Check lipid profile   Healthy diet - Lipid Panel  3. Aortic atherosclerosis (HCC) Keep LDL below 70 recommend statin this patient in the past has had discomforts and myalgias with statins  4. Pulmonary emphysema, unspecified emphysema type (HCC) Patient still smokes it is important for her to quit she is not at the point of wanting to quit she will consider tapering to a lower amount  5. Tobacco abuse See discussion above  6. Toenail fungus Will look into topical treatments available  through compounding pharmacy  7. Snoring Sleep  study indicated

## 2024-03-01 ENCOUNTER — Encounter: Payer: Self-pay | Admitting: Family Medicine

## 2024-03-11 LAB — LIPID PANEL
Chol/HDL Ratio: 2.7 ratio (ref 0.0–4.4)
Cholesterol, Total: 247 mg/dL — ABNORMAL HIGH (ref 100–199)
HDL: 92 mg/dL (ref >39)
LDL Chol Calc (NIH): 140 mg/dL — ABNORMAL HIGH (ref 0–99)
Triglycerides: 87 mg/dL (ref 0–149)
VLDL Cholesterol Cal: 15 mg/dL (ref 5–40)

## 2024-03-11 LAB — BASIC METABOLIC PANEL WITH GFR
BUN/Creatinine Ratio: 10 (ref 9–23)
BUN: 7 mg/dL (ref 6–24)
CO2: 22 mmol/L (ref 20–29)
Calcium: 10 mg/dL (ref 8.7–10.2)
Chloride: 92 mmol/L — ABNORMAL LOW (ref 96–106)
Creatinine, Ser: 0.69 mg/dL (ref 0.57–1.00)
Glucose: 107 mg/dL — ABNORMAL HIGH (ref 70–99)
Potassium: 5.2 mmol/L (ref 3.5–5.2)
Sodium: 132 mmol/L — ABNORMAL LOW (ref 134–144)
eGFR: 100 mL/min/1.73 (ref >59)

## 2024-03-11 LAB — MICROALBUMIN / CREATININE URINE RATIO
Creatinine, Urine: 52.2 mg/dL
Microalb/Creat Ratio: 10 mg/g{creat} (ref 0–29)
Microalbumin, Urine: 5.1 ug/mL

## 2024-03-12 ENCOUNTER — Ambulatory Visit: Payer: Self-pay | Admitting: Family Medicine

## 2024-08-25 ENCOUNTER — Other Ambulatory Visit: Payer: Self-pay

## 2024-08-25 DIAGNOSIS — F17210 Nicotine dependence, cigarettes, uncomplicated: Secondary | ICD-10-CM

## 2024-08-25 DIAGNOSIS — Z87891 Personal history of nicotine dependence: Secondary | ICD-10-CM

## 2024-08-25 DIAGNOSIS — Z122 Encounter for screening for malignant neoplasm of respiratory organs: Secondary | ICD-10-CM

## 2024-08-29 ENCOUNTER — Ambulatory Visit: Admitting: Family Medicine

## 2024-12-05 ENCOUNTER — Ambulatory Visit: Admitting: Family Medicine
